# Patient Record
Sex: Female | Born: 1970 | Race: White | Hispanic: No
Health system: Midwestern US, Community
[De-identification: ages and names within clinical notes are randomized; demographics above are authoritative.]

## PROBLEM LIST (undated history)

## (undated) DIAGNOSIS — M7072 Other bursitis of hip, left hip: Secondary | ICD-10-CM

## (undated) DIAGNOSIS — Z1231 Encounter for screening mammogram for malignant neoplasm of breast: Secondary | ICD-10-CM

## (undated) DIAGNOSIS — R1310 Dysphagia, unspecified: Secondary | ICD-10-CM

## (undated) DIAGNOSIS — R109 Unspecified abdominal pain: Principal | ICD-10-CM

## (undated) DIAGNOSIS — I10 Essential (primary) hypertension: Secondary | ICD-10-CM

## (undated) DIAGNOSIS — E785 Hyperlipidemia, unspecified: Secondary | ICD-10-CM

## (undated) DIAGNOSIS — J302 Other seasonal allergic rhinitis: Secondary | ICD-10-CM

## (undated) DIAGNOSIS — E119 Type 2 diabetes mellitus without complications: Secondary | ICD-10-CM

## (undated) HISTORY — DX: Other seasonal allergic rhinitis: J30.2

## (undated) HISTORY — PX: KIDNEY STONE SURGERY: SHX686

## (undated) HISTORY — DX: Hyperlipidemia, unspecified: E78.5

## (undated) HISTORY — PX: TUBAL LIGATION: SHX77

---

## 2015-04-24 ENCOUNTER — Emergency Department
Admit: 2015-04-24 | Disposition: A | Discharge status: Home / Self Care | Payer: Self-pay | Admitting: Emergency Medicine

## 2015-04-24 LAB — CBC WITH DIFFERENTIAL/PLATELET
BASOS ABS: 0.1 10*3/uL (ref 0.0–0.1)
BASOS PCT: 0.6 %
EOS ABS: 0.2 10*3/uL (ref 0.0–0.7)
Eosinophil %: 2 %
HCT: 26.5 % — ABNORMAL LOW (ref 35.0–47.0)
HGB: 8.3 g/dL — AB (ref 12.0–16.0)
LYMPHS PCT: 21.6 %
Lymphocyte #: 2 10*3/uL (ref 1.0–3.6)
MCH: 18.9 pg — AB (ref 26.0–34.0)
MCHC: 31.4 g/dL — AB (ref 32.0–36.0)
MCV: 60 fL — ABNORMAL LOW (ref 80–100)
MONO ABS: 0.4 x10 3/mm (ref 0.2–0.9)
Monocyte %: 4.3 %
Neutrophil #: 6.6 10*3/uL — ABNORMAL HIGH (ref 1.4–6.5)
Neutrophil %: 71.5 %
Platelet: 296 10*3/uL (ref 150–440)
RBC: 4.41 10*6/uL (ref 3.80–5.20)
RDW: 18.9 % — AB (ref 11.5–14.5)
WBC: 9.2 10*3/uL (ref 3.6–11.0)

## 2015-04-24 LAB — BASIC METABOLIC PANEL
Anion Gap: 7 (ref 7–16)
BUN: 11 mg/dL
CO2: 24 mmol/L
Calcium, Total: 8.9 mg/dL
Chloride: 106 mmol/L
Creatinine: 0.74 mg/dL
GLUCOSE: 153 mg/dL — AB
Potassium: 3.4 mmol/L — ABNORMAL LOW
Sodium: 137 mmol/L

## 2015-04-24 LAB — URINALYSIS, COMPLETE
BACTERIA: NONE SEEN
Bilirubin,UR: NEGATIVE
Blood: NEGATIVE
GLUCOSE, UR: NEGATIVE mg/dL (ref 0–75)
KETONE: NEGATIVE
NITRITE: NEGATIVE
Ph: 5 (ref 4.5–8.0)
Protein: 30
SPECIFIC GRAVITY: 1.016 (ref 1.003–1.030)

## 2015-04-24 LAB — TROPONIN I: Troponin-I: <0.03 ng/mL

## 2015-04-25 LAB — TROPONIN I: Troponin-I: <0.03 ng/mL

## 2015-04-25 LAB — PRO B NATRIURETIC PEPTIDE: B-Type Natriuretic Peptide: 95 pg/mL

## 2015-07-22 ENCOUNTER — Encounter: Payer: Self-pay | Admitting: *Deleted

## 2015-07-22 ENCOUNTER — Emergency Department
Admission: EM | Admit: 2015-07-22 | Discharge: 2015-07-22 | Disposition: A | Discharge status: Home / Self Care | Payer: Worker's Compensation | Attending: Emergency Medicine | Admitting: Emergency Medicine

## 2015-07-22 ENCOUNTER — Emergency Department: Payer: Worker's Compensation

## 2015-07-22 DIAGNOSIS — S3992XA Unspecified injury of lower back, initial encounter: Secondary | ICD-10-CM | POA: Insufficient documentation

## 2015-07-22 DIAGNOSIS — Y9389 Activity, other specified: Secondary | ICD-10-CM | POA: Diagnosis not present

## 2015-07-22 DIAGNOSIS — Z720 Tobacco use: Secondary | ICD-10-CM | POA: Diagnosis not present

## 2015-07-22 DIAGNOSIS — Y9289 Other specified places as the place of occurrence of the external cause: Secondary | ICD-10-CM | POA: Diagnosis not present

## 2015-07-22 DIAGNOSIS — M5136 Other intervertebral disc degeneration, lumbar region: Secondary | ICD-10-CM

## 2015-07-22 DIAGNOSIS — S5001XA Contusion of right elbow, initial encounter: Secondary | ICD-10-CM | POA: Diagnosis not present

## 2015-07-22 DIAGNOSIS — Y990 Civilian activity done for income or pay: Secondary | ICD-10-CM | POA: Insufficient documentation

## 2015-07-22 DIAGNOSIS — W010XXA Fall on same level from slipping, tripping and stumbling without subsequent striking against object, initial encounter: Secondary | ICD-10-CM | POA: Insufficient documentation

## 2015-07-22 MED ORDER — CYCLOBENZAPRINE HCL 10 MG PO TABS
10.0000 mg | ORAL_TABLET | Freq: Once | ORAL | Status: AC
  Administered 2015-07-22: 10 mg via ORAL
  Filled 2015-07-22: qty 1

## 2015-07-22 MED ORDER — CYCLOBENZAPRINE HCL 10 MG PO TABS: 10.0000 mg | ORAL_TABLET | Freq: Three times a day (TID) | ORAL | Status: DC | PRN

## 2015-07-22 NOTE — ED Provider Notes (Signed)
[facility] Emergency Department Provider Note  ____________________________________________  Time seen: 3:30 AM  I have reviewed the triage vital signs and the nursing notes.   HISTORY  Chief Complaint Back Pain     HPI Ashley Snow is a 44 y.o. female resents with low back pain status post slip and fall at work on a wet floor. Patient admits to nonradiating low back pain. Current pain score 8 out of 10. Pain does not radiate to the patient's legs     Past medical history none  There are no active problems to display for this patient.   Past surgical history None  Current Outpatient Rx  Name  Route  Sig  Dispense  Refill  . ibuprofen (ADVIL,MOTRIN) 200 MG tablet   Oral   Take 600 mg by mouth every 6 (six) hours as needed.         . IRON, FERROUS GLUCONATE, PO   Oral   Take 1 tablet by mouth daily.         . cyclobenzaprine (FLEXERIL) 10 MG tablet   Oral   Take 1 tablet (10 mg total) by mouth 3 (three) times daily as needed for muscle spasms.   30 tablet   0     Allergies Iodine   Social History History  Substance Use Topics  . Smoking status: Current Every Day Smoker  . Smokeless tobacco: Not on file  . Alcohol Use: Yes    Review of Systems  Constitutional: Negative for fever. Eyes: Negative for visual changes. ENT: Negative for sore throat. Cardiovascular: Negative for chest pain. Respiratory: Negative for shortness of breath. Gastrointestinal: Negative for abdominal pain, vomiting and diarrhea. Genitourinary: Negative for dysuria. Musculoskeletal: Positive for back pain. Skin: Negative for rash. Neurological: Negative for headaches, focal weakness or numbness.   10-point ROS otherwise negative.  ____________________________________________   PHYSICAL EXAM:  VITAL SIGNS: ED Triage Vitals  Enc Vitals Group     BP 07/22/15 0212 184/94 mmHg     Pulse Rate 07/22/15 0212 87     Resp 07/22/15 0212 20   Temp 07/22/15 0212 98.4 F (36.9 C)     Temp Source 07/22/15 0212 Oral     SpO2 07/22/15 0212 99 %     Weight 07/22/15 0212 240 lb (108.863 kg)     Height 07/22/15 0212 5\' 8"  (1.727 m)     Head Cir --      Peak Flow --      Pain Score 07/22/15 0214 7     Pain Loc --      Pain Edu? --      Excl. in GC? --      Constitutional: Alert and oriented. Well appearing and in no distress. Eyes: Conjunctivae are normal. PERRL. Normal extraocular movements. ENT   Head: Normocephalic and atraumatic.   Nose: No congestion/rhinnorhea.   Mouth/Throat: Mucous membranes are moist.   Neck: No stridor. Cardiovascular: Normal rate, regular rhythm. Normal and symmetric distal pulses are present in all extremities. No murmurs, rubs, or gallops. Respiratory: Normal respiratory effort without tachypnea nor retractions. Breath sounds are clear and equal bilaterally. No wheezes/rales/rhonchi. Gastrointestinal: Soft and nontender. No distention. There is no CVA tenderness. Genitourinary: deferred Musculoskeletal: Nontender with normal range of motion in all extremities. No joint effusions.  No lower extremity tenderness nor edema.tenderness to palpation right elbow and lumbar spine Neurologic:  Normal speech and language. No gross focal neurologic deficits are appreciated. Speech is normal.  Skin:  Skin  is warm, dry and intact. No rash noted. Psychiatric: Mood and affect are normal. Speech and behavior are normal. Patient exhibits appropriate insight and judgment.  ____________________________________________     RADIOLOGY IMPRESSION: 1. No fracture or dislocation. 2. Mild posterior soft tissue prominence. This can be seen in the setting of hematoma or edema.   Electronically Signed By: Rubye Oaks M.D. On: 07/22/2015 04:29   INITIAL IMPRESSION / ASSESSMENT AND PLAN / ED COURSE  Pertinent labs & imaging results that were available during my care of the patient were reviewed  by me and considered in my medical decision making (see chart for details).    ____________________________________________   FINAL CLINICAL IMPRESSION(S) / ED DIAGNOSES  Final diagnoses:  Degenerative disc disease, lumbar  Elbow contusion, right, initial encounter      Darci Current, MD 07/24/15 [phone]

## 2015-07-22 NOTE — Discharge Instructions (Signed)
Contusion A contusion is a deep bruise. Contusions are the result of an injury that caused bleeding under the skin. The contusion may turn blue, purple, or yellow. Minor injuries will give you a painless contusion, but more severe contusions may stay painful and swollen for a few weeks.  CAUSES  A contusion is usually caused by a blow, trauma, or direct force to an area of the body. SYMPTOMS   Swelling and redness of the injured area.  Bruising of the injured area.  Tenderness and soreness of the injured area.  Pain. DIAGNOSIS  The diagnosis can be made by taking a history and physical exam. An X-ray, CT scan, or MRI may be needed to determine if there were any associated injuries, such as fractures. TREATMENT  Specific treatment will depend on what area of the body was injured. In general, the best treatment for a contusion is resting, icing, elevating, and applying cold compresses to the injured area. Over-the-counter medicines may also be recommended for pain control. Ask your caregiver what the best treatment is for your contusion. HOME CARE INSTRUCTIONS   Put ice on the injured area.  Put ice in a plastic bag.  Place a towel between your skin and the bag.  Leave the ice on for 15-20 minutes, 3-4 times a day, or as directed by your health care provider.  Only take over-the-counter or prescription medicines for pain, discomfort, or fever as directed by your caregiver. Your caregiver may recommend avoiding anti-inflammatory medicines (aspirin, ibuprofen, and naproxen) for 48 hours because these medicines may increase bruising.  Rest the injured area.  If possible, elevate the injured area to reduce swelling. SEEK IMMEDIATE MEDICAL CARE IF:   You have increased bruising or swelling.  You have pain that is getting worse.  Your swelling or pain is not relieved with medicines. MAKE SURE YOU:   Understand these instructions.  Will watch your condition.  Will get help right  away if you are not doing well or get worse. Document Released: 09/21/2005 Document Revised: 12/17/2013 Document Reviewed: 10/17/2011 [facility] Patient Information 2015 [place], [place]. This information is not intended to replace advice given to you by your health care provider. Make sure you discuss any questions you have with your health care provider.  Degenerative Disk Disease Degenerative disk disease is a condition caused by the changes that occur in the cushions of the backbone (spinal disks) as you grow older. Spinal disks are soft and compressible disks located between the bones of the spine (vertebrae). They act like shock absorbers. Degenerative disk disease can affect the whole spine. However, the neck and lower back are most commonly affected. Many changes can occur in the spinal disks with aging, such as:  The spinal disks may dry and shrink.  Small tears may occur in the tough, outer covering of the disk (annulus).  The disk space may become smaller due to loss of water.  Abnormal growths in the bone (spurs) may occur. This can put pressure on the nerve roots exiting the spinal canal, causing pain.  The spinal canal may become narrowed. CAUSES  Degenerative disk disease is a condition caused by the changes that occur in the spinal disks with aging. The exact cause is not known, but there is a genetic basis for many patients. Degenerative changes can occur due to loss of fluid in the disk. This makes the disk thinner and reduces the space between the backbones. Small cracks can develop in the outer layer of the disk.  This can lead to the breakdown of the disk. You are more likely to get degenerative disk disease if you are overweight. Smoking cigarettes and doing heavy work such as weightlifting can also increase your risk of this condition. Degenerative changes can start after a sudden injury. Growth of bone spurs can compress the nerve roots and cause pain.  SYMPTOMS  The symptoms  vary from person to person. Some people may have no pain, while others have severe pain. The pain may be so severe that it can limit your activities. The location of the pain depends on the part of your backbone that is affected. You will have neck or arm pain if a disk in the neck area is affected. You will have pain in your back, buttocks, or legs if a disk in the lower back is affected. The pain becomes worse while bending, reaching up, or with twisting movements. The pain may start gradually and then get worse as time passes. It may also start after a [place] or minor injury. You may feel numbness or tingling in the arms or legs.  DIAGNOSIS  Your caregiver will ask you about your symptoms and about activities or habits that may cause the pain. He or she may also ask about any injuries, diseases, or treatments you have had earlier. Your caregiver will examine you to check for the range of movement that is possible in the affected area, to check for strength in your extremities, and to check for sensation in the areas of the arms and legs supplied by different nerve roots. An X-ray of the spine may be taken. Your caregiver may suggest other imaging tests, such as magnetic resonance imaging (MRI), if needed.  TREATMENT  Treatment includes rest, modifying your activities, and applying ice and heat. Your caregiver may prescribe medicines to reduce your pain and may ask you to do some exercises to strengthen your back. In some cases, you may need surgery. You and your caregiver will decide on the treatment that is best for you. HOME CARE INSTRUCTIONS   Follow proper lifting and walking techniques as advised by your caregiver.  Maintain good posture.  Exercise regularly as advised.  Perform relaxation exercises.  Change your sitting, standing, and sleeping habits as advised. Change positions frequently.  Lose weight as advised.  Stop smoking if you smoke.  Wear supportive footwear. SEEK MEDICAL CARE  IF:  Your pain does not go away within 1 to 4 weeks. SEEK IMMEDIATE MEDICAL CARE IF:   Your pain is severe.  You notice weakness in your arms, hands, or legs.  You begin to lose control of your bladder or bowel movements. MAKE SURE YOU:   Understand these instructions.  Will watch your condition.  Will get help right away if you are not doing well or get worse. Document Released: 10/09/2007 Document Revised: 03/05/2012 Document Reviewed: 04/15/2014 [facility] Patient Information 2015 [place], [place]. This information is not intended to replace advice given to you by your health care provider. Make sure you discuss any questions you have with your health care provider.

## 2015-07-22 NOTE — ED Notes (Signed)
Pt to triage via wheelchair. Pt fell tonight at work on a wet floor.  Pt has lower back pain. States WC

## 2015-07-22 NOTE — ED Notes (Signed)
Patient with no complaints at this time. Respirations even and unlabored. Skin warm/dry. Discharge instructions reviewed with patient at this time. Patient given opportunity to voice concerns/ask questions. Patient discharged at this time and left Emergency Department with steady gait.   

## 2015-10-01 ENCOUNTER — Observation Stay
Admission: EM | Admit: 2015-10-01 | Discharge: 2015-10-03 | Disposition: A | Discharge status: Home / Self Care | Payer: Self-pay | Attending: Internal Medicine | Admitting: Internal Medicine

## 2015-10-01 ENCOUNTER — Emergency Department: Payer: Self-pay

## 2015-10-01 ENCOUNTER — Encounter: Payer: Self-pay | Admitting: *Deleted

## 2015-10-01 DIAGNOSIS — R61 Generalized hyperhidrosis: Secondary | ICD-10-CM | POA: Insufficient documentation

## 2015-10-01 DIAGNOSIS — R51 Headache: Secondary | ICD-10-CM | POA: Insufficient documentation

## 2015-10-01 DIAGNOSIS — I160 Hypertensive urgency: Secondary | ICD-10-CM | POA: Insufficient documentation

## 2015-10-01 DIAGNOSIS — E119 Type 2 diabetes mellitus without complications: Secondary | ICD-10-CM | POA: Insufficient documentation

## 2015-10-01 DIAGNOSIS — Z8489 Family history of other specified conditions: Secondary | ICD-10-CM | POA: Insufficient documentation

## 2015-10-01 DIAGNOSIS — E876 Hypokalemia: Secondary | ICD-10-CM | POA: Insufficient documentation

## 2015-10-01 DIAGNOSIS — G92 Toxic encephalopathy: Principal | ICD-10-CM | POA: Insufficient documentation

## 2015-10-01 DIAGNOSIS — F17200 Nicotine dependence, unspecified, uncomplicated: Secondary | ICD-10-CM | POA: Insufficient documentation

## 2015-10-01 DIAGNOSIS — G459 Transient cerebral ischemic attack, unspecified: Secondary | ICD-10-CM

## 2015-10-01 DIAGNOSIS — R519 Headache, unspecified: Secondary | ICD-10-CM | POA: Diagnosis present

## 2015-10-01 DIAGNOSIS — Z91041 Radiographic dye allergy status: Secondary | ICD-10-CM | POA: Insufficient documentation

## 2015-10-01 DIAGNOSIS — Z79899 Other long term (current) drug therapy: Secondary | ICD-10-CM | POA: Insufficient documentation

## 2015-10-01 DIAGNOSIS — R7301 Impaired fasting glucose: Secondary | ICD-10-CM | POA: Insufficient documentation

## 2015-10-01 HISTORY — DX: Essential (primary) hypertension: I10

## 2015-10-01 LAB — CBC
HEMATOCRIT: 37.9 % (ref 35.0–47.0)
HEMOGLOBIN: 12.6 g/dL (ref 12.0–16.0)
MCH: 26.8 pg (ref 26.0–34.0)
MCHC: 33.2 g/dL (ref 32.0–36.0)
MCV: 80.8 fL (ref 80.0–100.0)
Platelets: 250 10*3/uL (ref 150–440)
RBC: 4.68 MIL/uL (ref 3.80–5.20)
RDW: 14.9 % — ABNORMAL HIGH (ref 11.5–14.5)
WBC: 16.6 10*3/uL — AB (ref 3.6–11.0)

## 2015-10-01 LAB — DIFFERENTIAL
BASOS ABS: 0.1 10*3/uL (ref 0–0.1)
Basophils Relative: 1 %
Eosinophils Absolute: 0 10*3/uL (ref 0–0.7)
Eosinophils Relative: 0 %
LYMPHS PCT: 12 %
Lymphs Abs: 2.1 10*3/uL (ref 1.0–3.6)
MONO ABS: 0.6 10*3/uL (ref 0.2–0.9)
MONOS PCT: 4 %
NEUTROS ABS: 13.8 10*3/uL — AB (ref 1.4–6.5)
Neutrophils Relative %: 83 %

## 2015-10-01 LAB — COMPREHENSIVE METABOLIC PANEL
ALK PHOS: 72 U/L (ref 38–126)
ALT: 52 U/L (ref 14–54)
AST: 54 U/L — AB (ref 15–41)
Albumin: 4.3 g/dL (ref 3.5–5.0)
Anion gap: 9 (ref 5–15)
BUN: 10 mg/dL (ref 6–20)
CALCIUM: 9.2 mg/dL (ref 8.9–10.3)
CHLORIDE: 106 mmol/L (ref 101–111)
CO2: 22 mmol/L (ref 22–32)
CREATININE: 0.82 mg/dL (ref 0.44–1.00)
Glucose, Bld: 177 mg/dL — ABNORMAL HIGH (ref 65–99)
Potassium: 3.4 mmol/L — ABNORMAL LOW (ref 3.5–5.1)
Sodium: 137 mmol/L (ref 135–145)
Total Bilirubin: 0.7 mg/dL (ref 0.3–1.2)
Total Protein: 7.6 g/dL (ref 6.5–8.1)

## 2015-10-01 LAB — GLUCOSE, CAPILLARY: Glucose-Capillary: 179 mg/dL — ABNORMAL HIGH (ref 65–99)

## 2015-10-01 LAB — APTT: APTT: 25 s (ref 24–36)

## 2015-10-01 LAB — PROTIME-INR
INR: 0.99
Prothrombin Time: 13.3 seconds (ref 11.4–15.0)

## 2015-10-01 MED ORDER — LORAZEPAM 2 MG/ML IJ SOLN
1.0000 mg | Freq: Once | INTRAMUSCULAR | Status: AC
  Administered 2015-10-01: 1 mg via INTRAVENOUS
  Filled 2015-10-01: qty 1

## 2015-10-01 MED ORDER — ENALAPRILAT 1.25 MG/ML IV SOLN
0.6250 mg | Freq: Once | INTRAVENOUS | Status: AC
  Administered 2015-10-01: 0.625 mg via INTRAVENOUS
  Filled 2015-10-01: qty 2

## 2015-10-01 MED ORDER — MORPHINE SULFATE (PF) 4 MG/ML IV SOLN
4.0000 mg | Freq: Once | INTRAVENOUS | Status: AC
  Administered 2015-10-01: 4 mg via INTRAVENOUS
  Filled 2015-10-01: qty 1

## 2015-10-01 MED ORDER — ONDANSETRON HCL 4 MG/2ML IJ SOLN
4.0000 mg | Freq: Once | INTRAMUSCULAR | Status: AC
  Administered 2015-10-01: 4 mg via INTRAVENOUS
  Filled 2015-10-01: qty 2

## 2015-10-01 NOTE — ED Provider Notes (Signed)
[facility] Emergency Department Provider Note  ____________________________________________  Time seen: Approximately 10:41 PM  I have reviewed the triage vital signs and the nursing notes.   HISTORY  Chief Complaint Headache    HPI Ashley Snow is a 44 y.o. female patient reports she used some cocaine a small amount this afternoon at about 2 or 2:30 and she took a nap. When she woke up from the nap she wanted to call her fianc but could not figure out how to use the cell phone that she's had for quite some time and notes that he is very easily. After some time she was able to figure and had used a cell phone and her confusion decreased. At that time her she began noticing a headache which gradually got worse and worse use a severe headache at the present time. Blood pressure was 199 systolic when I saw her. At that time she was not confused and everything was working normally but she had a severe headache. The headache was just severe it was not throbbing is not localized. I gave the patient 1 mg of Ativan and 4 morphine IV along with 4 of Zofran. Patient reports the headache markedly improved however her blood pressures got up to 206 systolic now I'm going to give her enalapril 0.625 IV in an effort to bring the blood pressure down I'm not been beta blockers in case there is any residual cocaine effect.  Past Medical History  Diagnosis Date  . Hypertension     There are no active problems to display for this patient.   Past Surgical History  Procedure Laterality Date  . Cesarean section      Current Outpatient Rx  Name  Route  Sig  Dispense  Refill  . acetaminophen (TYLENOL) 500 MG tablet   Oral   Take 500 mg by mouth every 6 (six) hours as needed.         . cyclobenzaprine (FLEXERIL) 10 MG tablet   Oral   Take 1 tablet (10 mg total) by mouth 3 (three) times daily as needed for muscle spasms. Patient not taking: Reported on 10/01/2015   30  tablet   0     Allergies Iodine  No family history on file.  Social History Social History  Substance Use Topics  . Smoking status: Current Every Day Smoker  . Smokeless tobacco: None  . Alcohol Use: Yes    Review of Systems Constitutional: No fever/chills Eyes: Patient reports a couple months of blurry vision and she reports she has to hold things at arm's length to be over read themat. Cardiovascular: Denies chest pain. Respiratory: Denies shortness of breath. Gastrointestinal: No abdominal pain.  No nausea, no vomiting.  No diarrhea.  No constipation. Genitourinary: Negative for dysuria. Musculoskeletal: Negative for back pain. Skin: Negative for rash. Neurological: Negative for headaches, focal weakness or numbness. GYN. Patient reports that in the last several months. Her periods become very irregular.  10-point ROS otherwise negative.  ____________________________________________   PHYSICAL EXAM:  VITAL SIGNS: ED Triage Vitals  Enc Vitals Group     BP 10/01/15 2119 199/129 mmHg     Pulse Rate 10/01/15 2119 108     Resp 10/01/15 2158 19     Temp 10/01/15 2119 98.8 F (37.1 C)     Temp Source 10/01/15 2119 Oral     SpO2 10/01/15 2119 98 %     Weight 10/01/15 2119 240 lb (108.863 kg)     Height  10/01/15 2119  (1.727 m)     Head Cir --      Peak Flow --      Pain Score 10/01/15 2129 7     Pain Loc --      Pain Edu? --      Excl. in GC? --    Constitutional: Alert and oriented. Well appearing andMoving uncomfortably on the bed with her headache.  Eyes: Conjunctivae are normal. PERRL. EOMI. Head: Atraumatic. Nose: No congestion/rhinnorhea. Mouth/Throat: Mucous membranes are moist.  Oropharynx non-erythematous. Neck: No stridor.  Neck is supple  Cardiovascular: Normal rate, regular rhythm. Grossly normal heart sounds.  Good peripheral circulation. Respiratory: Normal respiratory effort.  No retractions. Lungs CTAB. Gastrointestinal: Soft and  nontender. No distention. No abdominal bruits. No CVA tenderness. Musculoskeletal: No lower extremity tenderness nor edema.  No joint effusions. Neurologic:  Normal speech and language. No gross focal neurologic deficits are appreciated. No gait instability. Skin:  Skin is warm, dry and intact. No rash noted. Psychiatric: Mood and affect are normal. Speech and behavior are normal.  ____________________________________________   LABS (all labs ordered are listed, but only abnormal results are displayed)  Labs Reviewed  CBC - Abnormal; Notable for the following:    WBC 16.6 (*)    RDW 14.9 (*)    All other components within normal limits  DIFFERENTIAL - Abnormal; Notable for the following:    Neutro Abs 13.8 (*)    All other components within normal limits  COMPREHENSIVE METABOLIC PANEL - Abnormal; Notable for the following:    Potassium 3.4 (*)    Glucose, Bld 177 (*)    AST 54 (*)    All other components within normal limits  GLUCOSE, CAPILLARY - Abnormal; Notable for the following:    Glucose-Capillary 179 (*)    All other components within normal limits  PROTIME-INR  APTT  CBG MONITORING, ED   ____________________________________________  EKG  EKG read and interpreted by me shows sinus tachycardia at a rate of 103 normal axis nonspecific ST-T wave changes  __________________CT shows no acute changes___________________________________________   PROCEDURES    ____________________________________________   INITIAL IMPRESSION / ASSESSMENT AND PLAN / ED COURSE  Pertinent labs & imaging results that were available during my care of the patient were reviewed by me and considered in my medical decision making (see chart for details).   ____________________________________________   FINAL CLINICAL IMPRESSION(S) / ED DIAGNOSES  Final diagnoses:  Hypertensive urgency  Transient cerebral ischemia, unspecified transient cerebral ischemia type      Arnaldo Natal,  MD 10/01/15 2320

## 2015-10-01 NOTE — ED Notes (Signed)
Patient transported to CT via wheelchair with RN

## 2015-10-01 NOTE — ED Notes (Signed)
MD at bedside. 

## 2015-10-01 NOTE — ED Notes (Signed)
PT states she woke up around 1630 today, feeling confused, dizzy and sweating. Pt says that she was not able to dial the phone to call her husband, because she could not remember how to work the phone. Confusion lasting about 15-20 minutes, the she states her "mind was clearer". After this she had a severe headache associated with nausea.

## 2015-10-01 NOTE — ED Notes (Signed)
While going to CT scan, pt admits that she has used crack cocaine today and does not want her significant other who is with her to know.

## 2015-10-02 DIAGNOSIS — R519 Headache, unspecified: Secondary | ICD-10-CM | POA: Diagnosis present

## 2015-10-02 DIAGNOSIS — R51 Headache: Secondary | ICD-10-CM

## 2015-10-02 LAB — TSH: TSH: 1.46 u[IU]/mL (ref 0.350–4.500)

## 2015-10-02 MED ORDER — OXYCODONE HCL 5 MG PO TABS
5.0000 mg | ORAL_TABLET | Freq: Four times a day (QID) | ORAL | Status: DC | PRN
  Administered 2015-10-02 – 2015-10-03 (×4): 5 mg via ORAL
  Filled 2015-10-02 (×4): qty 1

## 2015-10-02 MED ORDER — MAGNESIUM SULFATE 2 GM/50ML IV SOLN
2.0000 g | Freq: Once | INTRAVENOUS | Status: AC
  Administered 2015-10-02: 2 g via INTRAVENOUS
  Filled 2015-10-02: qty 50

## 2015-10-02 MED ORDER — HYDRALAZINE HCL 20 MG/ML IJ SOLN
10.0000 mg | INTRAMUSCULAR | Status: DC | PRN
  Administered 2015-10-02 (×2): 10 mg via INTRAVENOUS
  Filled 2015-10-02 (×2): qty 1

## 2015-10-02 MED ORDER — LISINOPRIL 20 MG PO TABS
20.0000 mg | ORAL_TABLET | Freq: Every day | ORAL | Status: DC
  Administered 2015-10-02 – 2015-10-03 (×2): 20 mg via ORAL
  Filled 2015-10-02 (×2): qty 1

## 2015-10-02 MED ORDER — ONDANSETRON HCL 4 MG PO TABS: 4.0000 mg | ORAL_TABLET | Freq: Four times a day (QID) | ORAL | Status: DC | PRN

## 2015-10-02 MED ORDER — LORAZEPAM 2 MG/ML IJ SOLN
0.5000 mg | Freq: Once | INTRAMUSCULAR | Status: AC
  Administered 2015-10-02: 0.5 mg via INTRAVENOUS
  Filled 2015-10-02: qty 1

## 2015-10-02 MED ORDER — ONDANSETRON HCL 4 MG/2ML IJ SOLN: 4.0000 mg | Freq: Four times a day (QID) | INTRAMUSCULAR | Status: DC | PRN

## 2015-10-02 MED ORDER — AMLODIPINE BESYLATE 5 MG PO TABS
5.0000 mg | ORAL_TABLET | Freq: Every day | ORAL | Status: DC
  Administered 2015-10-02: 5 mg via ORAL
  Filled 2015-10-02: qty 1

## 2015-10-02 MED ORDER — POTASSIUM CHLORIDE IN NACL 20-0.9 MEQ/L-% IV SOLN
INTRAVENOUS | Status: DC
Start: 2015-10-02 — End: 2015-10-02
  Administered 2015-10-02: 04:00:00 via INTRAVENOUS
  Filled 2015-10-02 (×4): qty 1000

## 2015-10-02 MED ORDER — HEPARIN SODIUM (PORCINE) 5000 UNIT/ML IJ SOLN
5000.0000 [IU] | Freq: Three times a day (TID) | INTRAMUSCULAR | Status: DC
  Administered 2015-10-02 – 2015-10-03 (×3): 5000 [IU] via SUBCUTANEOUS
  Filled 2015-10-02 (×3): qty 1

## 2015-10-02 MED ORDER — METOCLOPRAMIDE HCL 5 MG/ML IJ SOLN: 5.0000 mg | Freq: Three times a day (TID) | INTRAMUSCULAR | Status: DC | PRN

## 2015-10-02 MED ORDER — ACETAMINOPHEN 650 MG RE SUPP: 650.0000 mg | Freq: Four times a day (QID) | RECTAL | Status: DC | PRN

## 2015-10-02 MED ORDER — MORPHINE SULFATE (PF) 2 MG/ML IV SOLN
2.0000 mg | Freq: Once | INTRAVENOUS | Status: AC
  Administered 2015-10-02: 2 mg via INTRAVENOUS
  Filled 2015-10-02: qty 1

## 2015-10-02 MED ORDER — SODIUM CHLORIDE 0.9 % IJ SOLN
3.0000 mL | Freq: Two times a day (BID) | INTRAMUSCULAR | Status: DC
  Administered 2015-10-02 – 2015-10-03 (×4): 3 mL via INTRAVENOUS

## 2015-10-02 MED ORDER — HYDROCHLOROTHIAZIDE 25 MG PO TABS
25.0000 mg | ORAL_TABLET | Freq: Every day | ORAL | Status: DC
  Administered 2015-10-02 – 2015-10-03 (×2): 25 mg via ORAL
  Filled 2015-10-02 (×2): qty 1

## 2015-10-02 MED ORDER — MORPHINE SULFATE (PF) 2 MG/ML IV SOLN
1.0000 mg | INTRAVENOUS | Status: DC | PRN
  Administered 2015-10-02 (×2): 1 mg via INTRAVENOUS
  Filled 2015-10-02 (×2): qty 1

## 2015-10-02 MED ORDER — ACETAMINOPHEN 325 MG PO TABS
650.0000 mg | ORAL_TABLET | Freq: Four times a day (QID) | ORAL | Status: DC | PRN
  Administered 2015-10-02 – 2015-10-03 (×3): 650 mg via ORAL
  Filled 2015-10-02 (×3): qty 2

## 2015-10-02 NOTE — Progress Notes (Signed)
Patient ID: Ashley Snow, female   DOB: [birth year], 44 y.o.   MRN: [id number] [facility] PROGRESS NOTE  PCP: No PCP Per Patient  HPI/Subjective: Patient uses crack cocaine and then had trouble thinking and trying to make a phone call. She had a severe headache. In the ER blood pressure was very elevated and she was admitted. Her headache got better after he gave some IV magnesium  Objective: Filed Vitals:   10/02/15 1307  BP: 184/92  Pulse: 95  Temp: 98.7 F (37.1 C)  Resp: 20    Filed Weights   10/01/15 2119 10/02/15 0226  Weight: 108.863 kg (240 lb) 115.486 kg (254 lb 9.6 oz)    ROS: Review of Systems  Constitutional: Positive for malaise/fatigue. Negative for fever and chills.  Eyes: Negative for blurred vision.  Respiratory: Negative for cough and shortness of breath.   Cardiovascular: Negative for chest pain.  Gastrointestinal: Negative for nausea, vomiting, abdominal pain, diarrhea and constipation.  Genitourinary: Negative for dysuria.  Musculoskeletal: Negative for joint pain.  Neurological: Positive for headaches. Negative for dizziness.   Exam: Physical Exam  Constitutional: She is oriented to person, place, and time.  HENT:  Nose: No mucosal edema.  Mouth/Throat: No oropharyngeal exudate or posterior oropharyngeal edema.  Eyes: Conjunctivae, EOM and lids are normal. Pupils are equal, round, and reactive to light.  Neck: No JVD present. Carotid bruit is not present. No edema present. No thyroid mass and no thyromegaly present.  Cardiovascular: S1 normal and S2 normal.  Exam reveals no gallop.   No murmur heard. Pulses:      Dorsalis pedis pulses are 2+ on the right side, and 2+ on the left side.  Respiratory: No respiratory distress. She has no wheezes. She has no rhonchi. She has no rales.  GI: Soft. Bowel sounds are normal. There is no tenderness.  Musculoskeletal:       Right ankle: She exhibits swelling.       Left ankle: She exhibits  swelling.  Lymphadenopathy:    She has no cervical adenopathy.  Neurological: She is alert and oriented to person, place, and time. No cranial nerve deficit.  Power 5 out of 5 upper and lower extremities  Skin: Skin is warm. No rash noted. Nails show no clubbing.  Psychiatric: She has a normal mood and affect.    Data Reviewed: Basic Metabolic Panel:  Recent Labs Lab 10/01/15 2158  NA 137  K 3.4*  CL 106  CO2 22  GLUCOSE 177*  BUN 10  CREATININE 0.82  CALCIUM 9.2   Liver Function Tests:  Recent Labs Lab 10/01/15 2158  AST 54*  ALT 52  ALKPHOS 72  BILITOT 0.7  PROT 7.6  ALBUMIN 4.3   CBC:  Recent Labs Lab 10/01/15 2158  WBC 16.6*  NEUTROABS 13.8*  HGB 12.6  HCT 37.9  MCV 80.8  PLT 250    CBG:  Recent Labs Lab 10/01/15 2230  GLUCAP 179*   Studies: Ct Head Wo Contrast  10/01/2015   CLINICAL DATA:  Confusion, diaphoresis, severe headache.  EXAM: CT HEAD WITHOUT CONTRAST  TECHNIQUE: Contiguous axial images were obtained from the base of the skull through the vertex without intravenous contrast.  COMPARISON:  04/24/2015  FINDINGS: There is no intracranial hemorrhage, mass or evidence of acute infarction. There is slight generalized atrophy with mild expansion of the CSF spaces around the frontal lobes. This is unchanged. No acute intracranial findings are evident. Gray matter and white matter are unremarkable,  with normal differentiation. No bone abnormalities evident. Visible paranasal sinuses are clear.  IMPRESSION: No acute intracranial findings.  Mild generalized atrophy.   Electronically Signed   By: Ellery Plunk M.D.   On: 10/01/2015 21:55    Scheduled Meds: . heparin  5,000 Units Subcutaneous 3 times per day  . hydrochlorothiazide  25 mg Oral Daily  . lisinopril  20 mg Oral Daily  . sodium chloride  3 mL Intravenous Q12H    Assessment/Plan:  1. Acute encephalopathy- resolved. Likely from drug use. 2. Accelerated hypertension- I ordered a  dose of Norvasc this morning. Likely the patient will not be able to afford this medication. I ordered hydrochlorothiazide. I will add lisinopril. Serial blood pressure readings. 3. Acute intractable headache unspecified- better after magnesium. Could be secondary to drug use or accelerated hypertension. Try to get better blood pressure control. DC IV morphine. 4. Impaired fasting glucose- check a hemoglobin A1c  Code Status:     Code Status Orders        Start     Ordered   10/02/15 0222  Full code   Continuous     10/02/15 0221     Disposition Plan: Potentially home tomorrow  Time spent: 35 minutes (patient seen earlier today)  Alford Highland  [facility]

## 2015-10-02 NOTE — H&P (Signed)
Ashley Snow is an 44 y.o. female.   Chief Complaint: Headache HPI: The patient presents emergency department complaining of headache that began approximately 6 hours prior to arrival. She admits that she smoked crack cocaine and then developed a global headache. She has not had any visual symptoms, motor weakness or sensory changes. She admits to being confused for approximately 20 minutes after the onset of her headache but she was able to drive this evening to pick up her boyfriend. However, once he was in the car with her he felt she may be too confused to drive which prompted him to bring her to the hospital. In the emergency department she was found to be hypertensive. Due to her ongoing headache and uncontrolled hypertension emergency department staff called for admission.  Past Medical History  Diagnosis Date  . Hypertension     Past Surgical History  Procedure Laterality Date  . Cesarean section      Family History  Problem Relation Age of Onset  . Dementia Mother    Social History:  reports that she has been smoking.  She does not have any smokeless tobacco history on file. She reports that she drinks alcohol. Her drug history is not on file.  Allergies:  Allergies  Allergen Reactions  . Iodine Rash    Medications Prior to Admission  Medication Sig Dispense Refill  . acetaminophen (TYLENOL) 500 MG tablet Take 500 mg by mouth every 6 (six) hours as needed.    . cyclobenzaprine (FLEXERIL) 10 MG tablet Take 1 tablet (10 mg total) by mouth 3 (three) times daily as needed for muscle spasms. (Patient not taking: Reported on 10/01/2015) 30 tablet 0    Results for orders placed or performed during the hospital encounter of 10/01/15 (from the past 48 hour(s))  Protime-INR     Status: None   Collection Time: 10/01/15  9:58 PM  Result Value Ref Range   Prothrombin Time 13.3 11.4 - 15.0 seconds   INR 0.99   APTT     Status: None   Collection Time: 10/01/15  9:58 PM  Result Value  Ref Range   aPTT 25 24 - 36 seconds  CBC     Status: Abnormal   Collection Time: 10/01/15  9:58 PM  Result Value Ref Range   WBC 16.6 (H) 3.6 - 11.0 K/uL   RBC 4.68 3.80 - 5.20 MIL/uL   Hemoglobin 12.6 12.0 - 16.0 g/dL   HCT 37.9 35.0 - 47.0 %   MCV 80.8 80.0 - 100.0 fL   MCH 26.8 26.0 - 34.0 pg   MCHC 33.2 32.0 - 36.0 g/dL   RDW 14.9 (H) 11.5 - 14.5 %   Platelets 250 150 - 440 K/uL  Differential     Status: Abnormal   Collection Time: 10/01/15  9:58 PM  Result Value Ref Range   Neutrophils Relative % 83 %   Neutro Abs 13.8 (H) 1.4 - 6.5 K/uL   Lymphocytes Relative 12 %   Lymphs Abs 2.1 1.0 - 3.6 K/uL   Monocytes Relative 4 %   Monocytes Absolute 0.6 0.2 - 0.9 K/uL   Eosinophils Relative 0 %   Eosinophils Absolute 0.0 0 - 0.7 K/uL   Basophils Relative 1 %   Basophils Absolute 0.1 0 - 0.1 K/uL  Comprehensive metabolic panel     Status: Abnormal   Collection Time: 10/01/15  9:58 PM  Result Value Ref Range   Sodium 137 135 - 145 mmol/L   Potassium 3.4 (  L) 3.5 - 5.1 mmol/L   Chloride 106 101 - 111 mmol/L   CO2 22 22 - 32 mmol/L   Glucose, Bld 177 (H) 65 - 99 mg/dL   BUN 10 6 - 20 mg/dL   Creatinine, Ser 0.82 0.44 - 1.00 mg/dL   Calcium 9.2 8.9 - 10.3 mg/dL   Total Protein 7.6 6.5 - 8.1 g/dL   Albumin 4.3 3.5 - 5.0 g/dL   AST 54 (H) 15 - 41 U/L   ALT 52 14 - 54 U/L   Alkaline Phosphatase 72 38 - 126 U/L   Total Bilirubin 0.7 0.3 - 1.2 mg/dL   GFR calc non Af Amer >60 >60 mL/min   GFR calc Af Amer >60 >60 mL/min    Comment: (NOTE) The eGFR has been calculated using the CKD EPI equation. This calculation has not been validated in all clinical situations. eGFR's persistently <60 mL/min signify possible Chronic Kidney Disease.    Anion gap 9 5 - 15  Glucose, capillary     Status: Abnormal   Collection Time: 10/01/15 10:30 PM  Result Value Ref Range   Glucose-Capillary 179 (H) 65 - 99 mg/dL   Ct Head Wo Contrast  10/01/2015   CLINICAL DATA:  Confusion, diaphoresis,  severe headache.  EXAM: CT HEAD WITHOUT CONTRAST  TECHNIQUE: Contiguous axial images were obtained from the base of the skull through the vertex without intravenous contrast.  COMPARISON:  04/24/2015  FINDINGS: There is no intracranial hemorrhage, mass or evidence of acute infarction. There is slight generalized atrophy with mild expansion of the CSF spaces around the frontal lobes. This is unchanged. No acute intracranial findings are evident. Gray matter and white matter are unremarkable, with normal differentiation. No bone abnormalities evident. Visible paranasal sinuses are clear.  IMPRESSION: No acute intracranial findings.  Mild generalized atrophy.   Electronically Signed   By: Andreas Newport M.D.   On: 10/01/2015 21:55    Review of Systems  Constitutional: Negative for fever and chills.  HENT: Negative for sore throat and tinnitus.   Eyes: Negative for blurred vision and redness.  Respiratory: Negative for cough and shortness of breath.   Cardiovascular: Negative for chest pain, palpitations, orthopnea and PND.  Gastrointestinal: Positive for nausea. Negative for vomiting, abdominal pain and diarrhea.  Genitourinary: Negative for dysuria, urgency and frequency.  Musculoskeletal: Negative for myalgias and joint pain.  Skin: Negative for rash.       No lesions  Neurological: Positive for headaches. Negative for speech change, focal weakness and weakness.  Endo/Heme/Allergies: Does not bruise/bleed easily.       No temperature intolerance  Psychiatric/Behavioral: Negative for depression and suicidal ideas.    Blood pressure 179/103, pulse 89, temperature 97.4 F (36.3 C), temperature source Oral, resp. rate 18, height _0  (1.676 m), weight 115.486 kg (254 lb 9.6 oz), last menstrual period 10/01/2015, SpO2 99 %. Physical Exam  Nursing note and vitals reviewed. Constitutional: She is oriented to person, place, and time. She appears well-developed and well-nourished.  HENT:  Head:  Normocephalic and atraumatic.  Eyes: Conjunctivae and EOM are normal. Pupils are equal, round, and reactive to light. No scleral icterus.  Neck: Normal range of motion. No JVD present. No tracheal deviation present. No thyromegaly present.  Cardiovascular: Normal rate, regular rhythm and normal heart sounds.  Exam reveals no gallop and no friction rub.   No murmur heard. Respiratory: Effort normal and breath sounds normal.  GI: Soft. Bowel sounds are normal. She exhibits no  distension. There is no tenderness.  Genitourinary:  Deferred  Musculoskeletal: Normal range of motion. She exhibits no edema.  Lymphadenopathy:    She has no cervical adenopathy.  Neurological: She is alert and oriented to person, place, and time. No cranial nerve deficit. She exhibits normal muscle tone.  Skin: Skin is warm and dry. No rash noted. No erythema.  Psychiatric: She has a normal mood and affect. Her behavior is normal. Judgment and thought content normal.     Assessment/Plan This is a 44 year old Caucasian female admitted for persistent headache. 1. Headache: Likely sequelae of uncontrolled hypertension. Does not meet cluster criteria or specifically migraine or tension-type headache. Goal is to manage blood pressure and hydrate. Reglan and morphine as needed. 2. Hypertension: The patient is received 1 dose of ACE inhibitor in the emergency department. 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 Will use Ativan as needed. 3. Hypokalemia: Replete potassium 4. DVT prophylaxis: Heparin 5. GI prophylaxis: None The patient is a full code. Time spent on admission was patient approximately 35 minutes  Harrie Foreman 10/02/2015, 3:11 AM

## 2015-10-02 NOTE — Plan of Care (Signed)
Problem: Discharge Progression Outcomes Goal: Discharge plan in place and appropriate Individualization:  Pt prefers to be called Ashley Snow who lives at home w/ her boyfriend and his mother. Both are currently out of work. Pt expresses stress over current circumstances  Hx HTN but is currently not taking any medication for it.  Moderate fall risk. +1 standby assist to bathroom. Pt understands how to use call system for assistance Goal: Other Discharge Outcomes/Goals Outcome: Progressing Plan of care progress to goals: 1. C/o headache relieved by PRN Morphine  2. Hemodynamically:             -High blood pressure, PRN Hydralazine given w/ noted relief              -IVF infusing as ordered              -Neuro Q2H 3. Heart healthy diet ordered, no appetite when she arrived to the floor 4. +1 standby assist to bathroom. Pt states she is currently on her menstrual period.

## 2015-10-02 NOTE — Plan of Care (Signed)
Problem: Discharge Progression Outcomes Goal: Activity appropriate for discharge plan Outcome: Progressing Pt is alert and oriented x 4, c/o headache improved with iv morphine, iv morphine d/c and patient start on oxycodone po. Pt is up in room independently, on room air, good appetite, bp remains elevated, pt on hydrochlorothiazide and lisinopril throughout shift, IV fluids d/c, IV magnesium given once. P is liekly to d/c on 10/8 to home. Uneventful shift.

## 2015-10-02 NOTE — Care Management (Signed)
Admitted to [facility] under observation with the diagnosis headache. Lives with finance Molly Maduro [place] [phone]).  Three kids ages 43,18, and 36. States she moved from [place]. To [place] about 4 years ago, moved to [place] about 3 years ago. Last primary care physician she seen was 4 years ago. No insurance. Suppose to start new job at [employer]. No hospitalizations other than child birth. Takes care of all basic activities of daily living herself, drives.  Last fall was in July. Good appetite. A resident of [place]. Applications for [facility] and Medication Management given to Ms. Amrhein. Will update Lelon Mast at [employer]. Gwenette Greet RN MSN Care Management [phone]

## 2015-10-02 NOTE — Clinical Social Work Note (Signed)
CSW consulted for assistance with medications. CSW notified RN CM and the RN CM will see patient for assistance with medications. Please reconsult CSW if necessary. York Spaniel MSW,LCSW [phone]

## 2015-10-02 NOTE — ED Notes (Signed)
Admitting MD at bedside.

## 2015-10-03 LAB — BASIC METABOLIC PANEL
Anion gap: 9 (ref 5–15)
BUN: 11 mg/dL (ref 6–20)
CHLORIDE: 101 mmol/L (ref 101–111)
CO2: 26 mmol/L (ref 22–32)
Calcium: 9.1 mg/dL (ref 8.9–10.3)
Creatinine, Ser: 0.7 mg/dL (ref 0.44–1.00)
GFR calc non Af Amer: >60 mL/min (ref >60)
Glucose, Bld: 179 mg/dL — ABNORMAL HIGH (ref 65–99)
POTASSIUM: 3.1 mmol/L — AB (ref 3.5–5.1)
SODIUM: 136 mmol/L (ref 135–145)

## 2015-10-03 LAB — MAGNESIUM: MAGNESIUM: 1.7 mg/dL (ref 1.7–2.4)

## 2015-10-03 LAB — HEMOGLOBIN A1C: HEMOGLOBIN A1C: 7.4 % — AB (ref 4.0–6.0)

## 2015-10-03 MED ORDER — LISINOPRIL 20 MG PO TABS: 20.0000 mg | ORAL_TABLET | Freq: Every day | ORAL | Status: DC

## 2015-10-03 MED ORDER — HYDROCHLOROTHIAZIDE 25 MG PO TABS: 25.0000 mg | ORAL_TABLET | Freq: Every day | ORAL | Status: DC

## 2015-10-03 MED ORDER — POTASSIUM CHLORIDE CRYS ER 20 MEQ PO TBCR
60.0000 meq | EXTENDED_RELEASE_TABLET | Freq: Once | ORAL | Status: AC
  Administered 2015-10-03: 08:00:00 60 meq via ORAL
  Filled 2015-10-03: qty 3

## 2015-10-03 NOTE — Progress Notes (Signed)
Up ad lib in room and tolerating well.Tylenol effective for reducing HA. Tolerating diet well. Fiance at bedside. Anticipating going home today.

## 2015-10-03 NOTE — Progress Notes (Signed)
MD order received in [facility] to discharge pt home today; verbally reviewed AVS with pt including medications/gave Rxs for Lisinopril and Hydrochlorothiazide to pt; pt verbalized understanding with no further questions voiced at this time; pt discharged via wheelchair by nursing to the visitor's entrance

## 2015-10-03 NOTE — Discharge Summary (Addendum)
[facility] [place] at [facility]   PATIENT NAME: Ashley Snow    MR#:  [id number]  DATE OF BIRTH:  10-23-1971  DATE OF ADMISSION:  10/01/2015 ADMITTING PHYSICIAN: Arnaldo Natal, MD  DATE OF DISCHARGE: 10/03/2015  9:50 AM  PRIMARY CARE PHYSICIAN: No PCP Per Patient    ADMISSION DIAGNOSIS:  Hypertensive urgency [I16.0] Transient cerebral ischemia, unspecified transient cerebral ischemia type [G45.9]   DISCHARGE DIAGNOSIS:  Acute intractable headache unspecified Accelerated hypertension DM SECONDARY DIAGNOSIS:   Past Medical History  Diagnosis Date  . Hypertension     HOSPITAL COURSE:   1. Acute encephalopathy- resolved. Likely from drug use. Head CT is negative. 2. Accelerated hypertension-she was treated with a dose of Norvasc but likely the patient will not be able to afford this medication. She has been treated with hydrochlorothiazide and lisinopril. Blood pressure is better controlled. 3. Acute intractable headache unspecified- better after magnesium. Could be secondary to drug use or accelerated hypertension. Follow-up PCP as outpatient. 4. DM2.  hemoglobin A1c 7.4. The patient needs tighter control and exercise, follow-up PCP. 5.  Hypokalemia. The patient was treated with a potassium supplement. DISCHARGE CONDITIONS:   Stable, discharged home today.  CONSULTS OBTAINED:  Treatment Team:  Shaune Pollack, MD  DRUG ALLERGIES:   Allergies  Allergen Reactions  . Iodine Rash    DISCHARGE MEDICATIONS:   Discharge Medication List as of 10/03/2015  9:42 AM    START taking these medications   Details  hydrochlorothiazide (HYDRODIURIL) 25 MG tablet Take 1 tablet (25 mg total) by mouth daily., Starting 10/03/2015, Until Discontinued, Print    lisinopril (PRINIVIL,ZESTRIL) 20 MG tablet Take 1 tablet (20 mg total) by mouth daily., Starting 10/03/2015, Until Discontinued, Print      CONTINUE these medications which have NOT CHANGED   Details   acetaminophen (TYLENOL) 500 MG tablet Take 500 mg by mouth every 6 (six) hours as needed., Until Discontinued, Historical Med      STOP taking these medications     cyclobenzaprine (FLEXERIL) 10 MG tablet          DISCHARGE INSTRUCTIONS:    If you experience worsening of your admission symptoms, develop shortness of breath, life threatening emergency, suicidal or homicidal thoughts you must seek medical attention immediately by calling 911 or calling your MD immediately  if symptoms less severe.  You Must read complete instructions/literature along with all the possible adverse reactions/side effects for all the Medicines you take and that have been prescribed to you. Take any new Medicines after you have completely understood and accept all the possible adverse reactions/side effects.   Please note  You were cared for by a hospitalist during your hospital stay. If you have any questions about your discharge medications or the care you received while you were in the hospital after you are discharged, you can call the unit and asked to speak with the hospitalist on call if the hospitalist that took care of you is not available. Once you are discharged, your primary care physician will handle any further medical issues. Please note that NO REFILLS for any discharge medications will be authorized once you are discharged, as it is imperative that you return to your primary care physician (or establish a relationship with a primary care physician if you do not have one) for your aftercare needs so that they can reassess your need for medications and monitor your lab values.    Today   SUBJECTIVE   Mild  Headache.   VITAL SIGNS:  Blood pressure 168/94, pulse 87, temperature 98.3 F (36.8 C), temperature source Oral, resp. rate 20, height  (1.676 m), weight 116.665 kg (257 lb 3.2 oz), last menstrual period 10/01/2015, SpO2 96 %.  I/O:   Intake/Output Summary (Last 24 hours) at  10/03/15 1442 Last data filed at 10/03/15 0936  Gross per 24 hour  Intake    720 ml  Output      0 ml  Net    720 ml    PHYSICAL EXAMINATION:  GENERAL:  44 y.o.-year-old patient lying in the bed with no acute distress. Obese. EYES: Pupils equal, round, reactive to light and accommodation. No scleral icterus. Extraocular muscles intact.  HEENT: Head atraumatic, normocephalic. Oropharynx and nasopharynx clear. Moist oral mucosa. NECK:  Supple, no jugular venous distention. No thyroid enlargement, no tenderness.  LUNGS: Normal breath sounds bilaterally, no wheezing, rales,rhonchi or crepitation. No use of accessory muscles of respiration.  CARDIOVASCULAR: S1, S2 normal. No murmurs, rubs, or gallops.  ABDOMEN: Soft, non-tender, non-distended. Bowel sounds present. No organomegaly or mass.  EXTREMITIES: No pedal edema, cyanosis, or clubbing.  NEUROLOGIC: Cranial nerves II through XII are intact. Muscle strength 5/5 in all extremities. Sensation intact. Gait not checked.  PSYCHIATRIC: The patient is alert and oriented x 3.  SKIN: No obvious rash, lesion, or ulcer.   DATA REVIEW:   CBC  Recent Labs Lab 10/01/15 2158  WBC 16.6*  HGB 12.6  HCT 37.9  PLT 250    Chemistries   Recent Labs Lab 10/01/15 2158 10/03/15 0440  NA 137 136  K 3.4* 3.1*  CL 106 101  CO2 22 26  GLUCOSE 177* 179*  BUN 10 11  CREATININE 0.82 0.70  CALCIUM 9.2 9.1  MG  --  1.7  AST 54*  --   ALT 52  --   ALKPHOS 72  --   BILITOT 0.7  --     Cardiac Enzymes No results for input(s): TROPONINI in the last 168 hours.  Microbiology Results  No results found for this or any previous visit.  RADIOLOGY:  Ct Head Wo Contrast  10/01/2015   CLINICAL DATA:  Confusion, diaphoresis, severe headache.  EXAM: CT HEAD WITHOUT CONTRAST  TECHNIQUE: Contiguous axial images were obtained from the base of the skull through the vertex without intravenous contrast.  COMPARISON:  04/24/2015  FINDINGS: There is no  intracranial hemorrhage, mass or evidence of acute infarction. There is slight generalized atrophy with mild expansion of the CSF spaces around the frontal lobes. This is unchanged. No acute intracranial findings are evident. Gray matter and white matter are unremarkable, with normal differentiation. No bone abnormalities evident. Visible paranasal sinuses are clear.  IMPRESSION: No acute intracranial findings.  Mild generalized atrophy.   Electronically Signed   By: Ellery Plunk M.D.   On: 10/01/2015 21:55        Management plans discussed with the patient, family and they are in agreement.  CODE STATUS:   TOTAL TIME TAKING CARE OF THIS PATIENT: 30 minutes.    Shaune Pollack M.D on 10/03/2015 at 2:42 PM  Between 7am to 6pm - Pager - [phone]  After 6pm go to [email or web address] - password [email or web address] [facility]  [facility]  Office  [phone]  CC: Primary care physician; No PCP Per Patient

## 2015-10-03 NOTE — Plan of Care (Signed)
Problem: Discharge Progression Outcomes Goal: Other Discharge Outcomes/Goals Outcome: Progressing Plan of care progress to goal for: 1. Pain-pt c/o pain in her head and back, prn meds given with improvement 2. Hemodynamically-             -VSS, pt remains afebrile 3. Complications-pt had elevated BP, prn meds given with improvement 4. Diet-pt tolerating diet this shift 5. Activity-pt up tot he BR unassisted

## 2015-10-03 NOTE — Discharge Instructions (Signed)
Low sodium, heart healthy and ADA diet. °Activity as tolerated. °

## 2015-10-14 IMAGING — CT CT HEAD W/O CM
3 of 4 series · 17 of 30 positions shown, 19 images · non-contrast
Comparison: 04/24/2015

CLINICAL DATA: Confusion, diaphoresis, severe headache.

EXAM:
CT HEAD WITHOUT CONTRAST
TECHNIQUE: Contiguous axial images were obtained from the base of the skull
through the vertex without intravenous contrast.

[Series 2: head wo · axial · 0.43mm/px · z∈[+655,+765]mm · 8 of 30 slices shown, 10 images]
[im 4/30  brain]
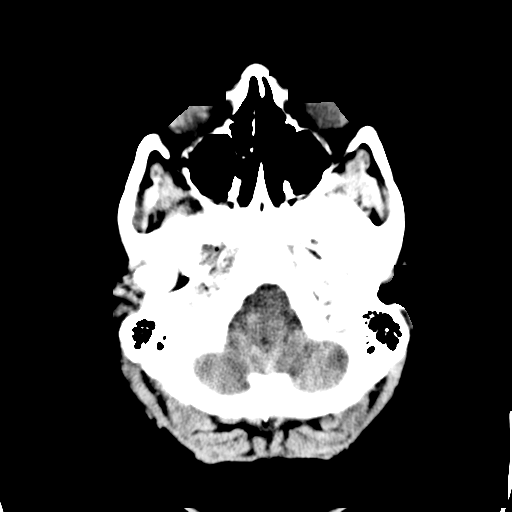
[im 4/30  bone]
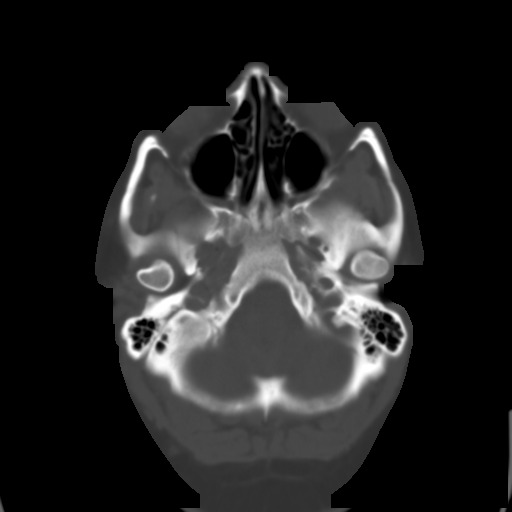
[im 7/30  brain]
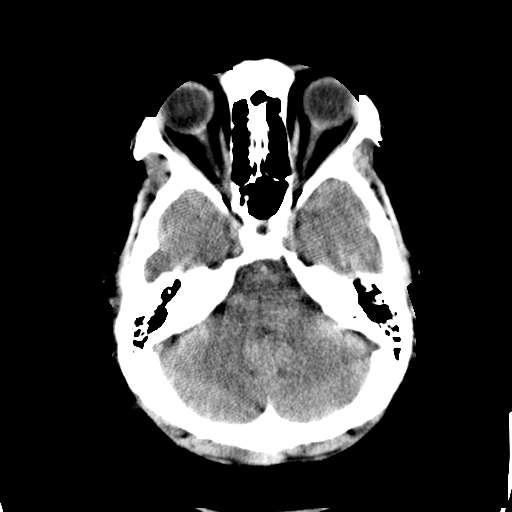
[im 10/30  brain]
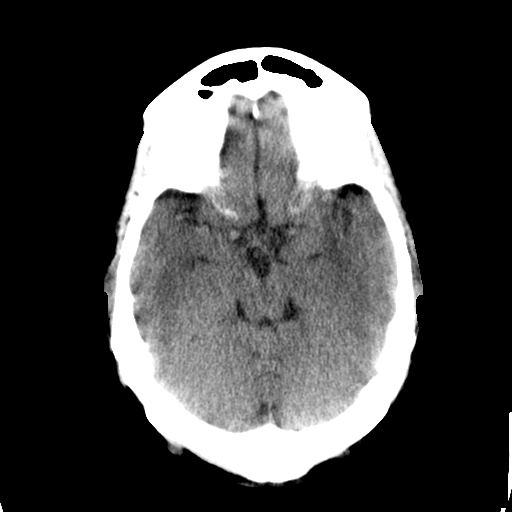
[im 13/30  brain]
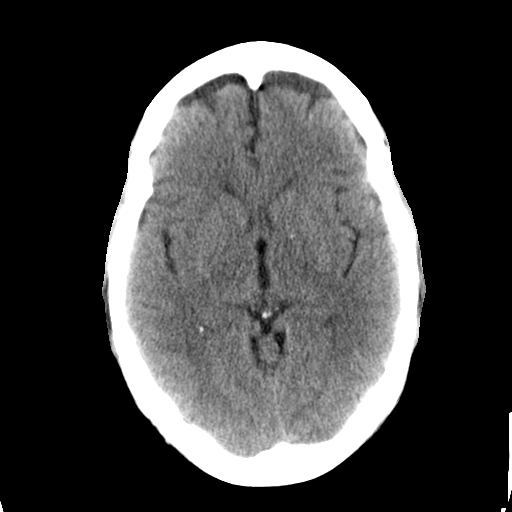
[im 17/30  brain]
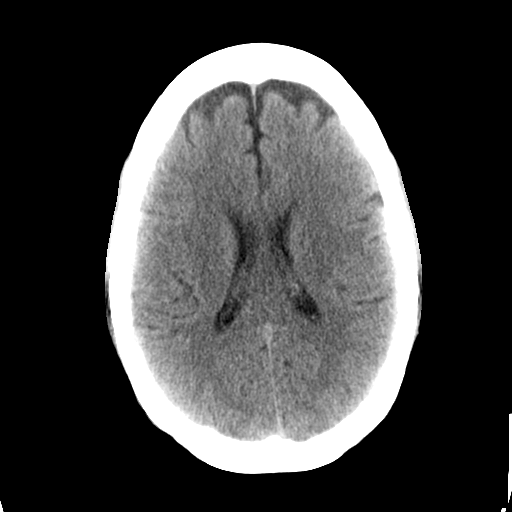
[im 17/30  bone]
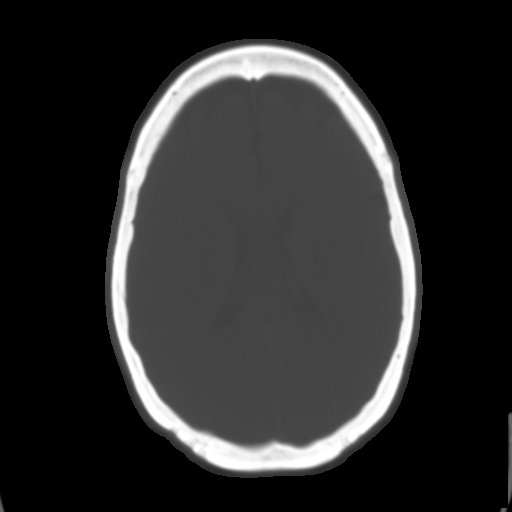
[im 20/30  brain]
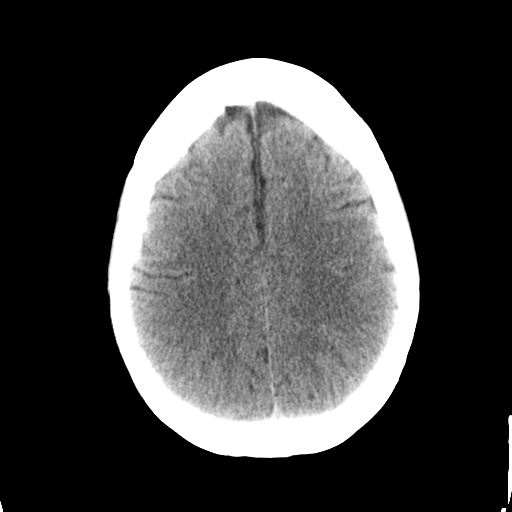
[im 23/30  brain]
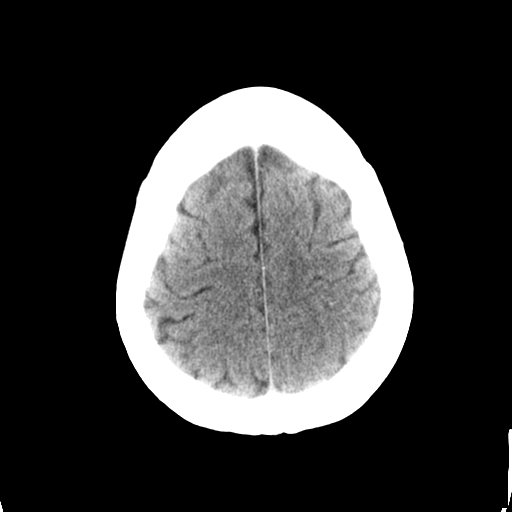
[im 26/30  brain]
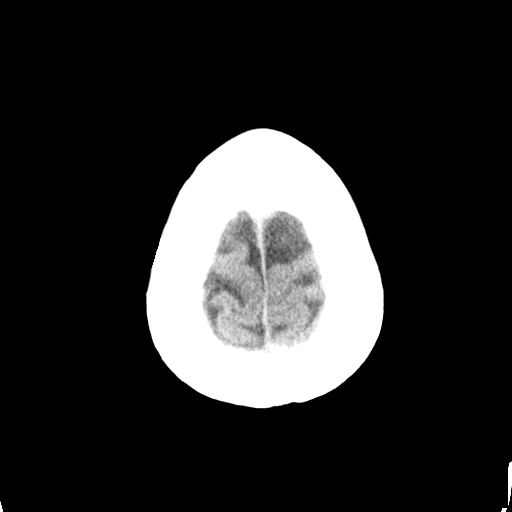

[Series 6: head wo recons · axial · 0.43mm/px · z∈[+705,+809]mm · 7 of 30 slices shown]
[im 4/30  brain]
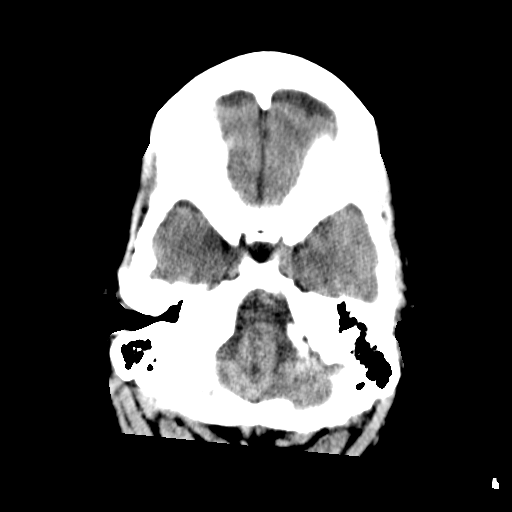
[im 8/30  brain]
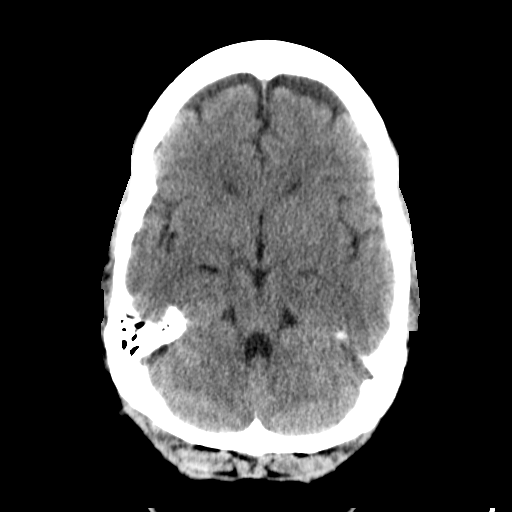
[im 11/30  brain]
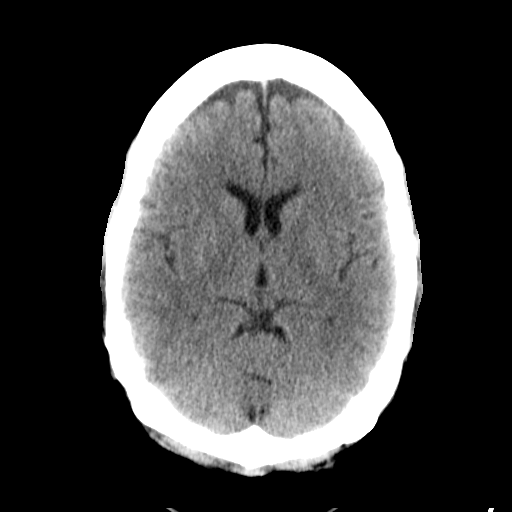
[im 15/30  brain]
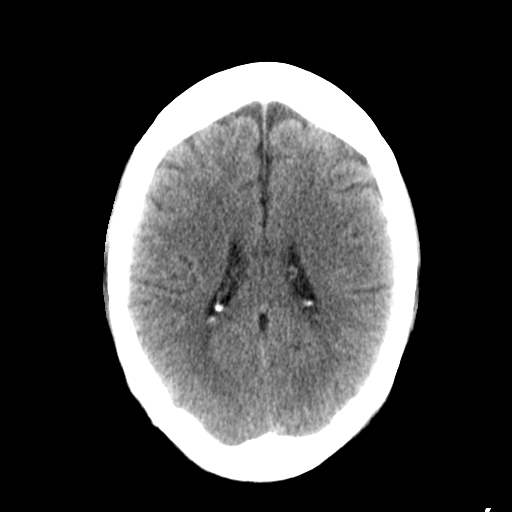
[im 19/30  brain]
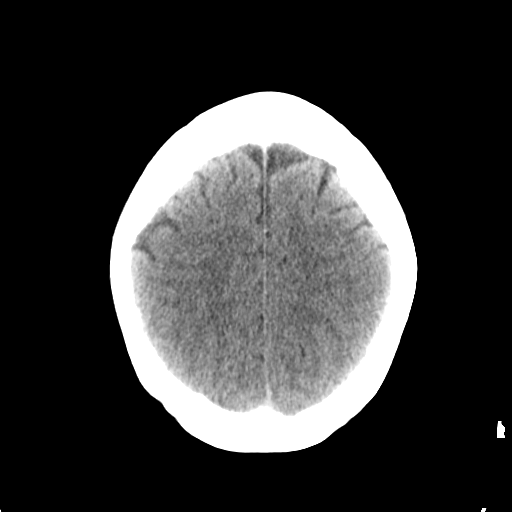
[im 22/30  brain]
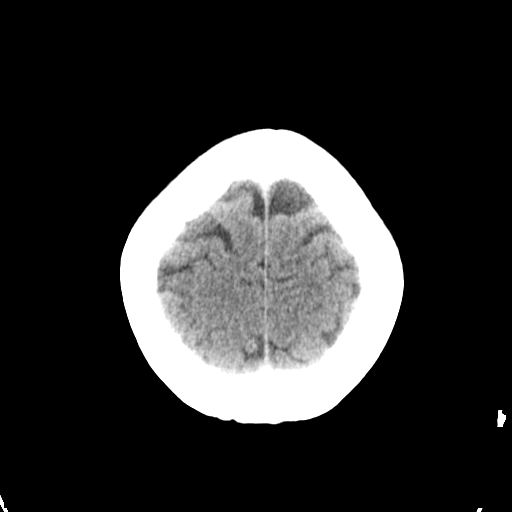
[im 26/30  brain]
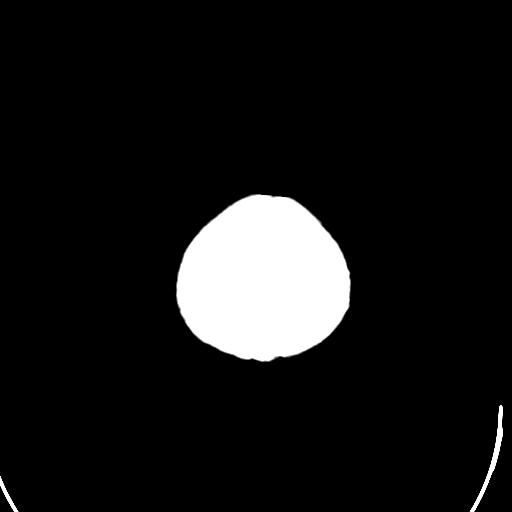

[Series 7: head bone recons · axial · 0.43mm/px · z∈[+702,+721]mm · 2 of 31 slices shown]
[im 4/31  bone]
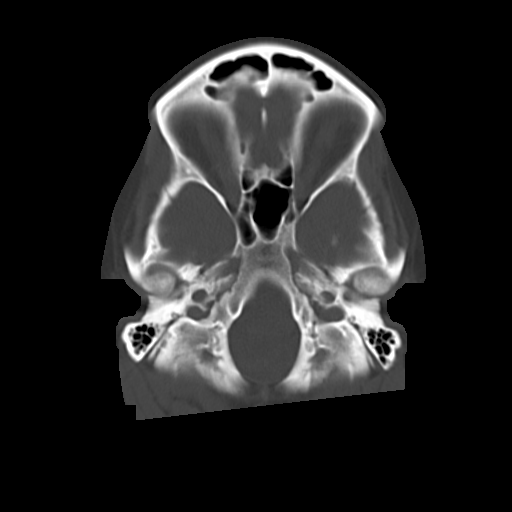
[im 8/31  bone]
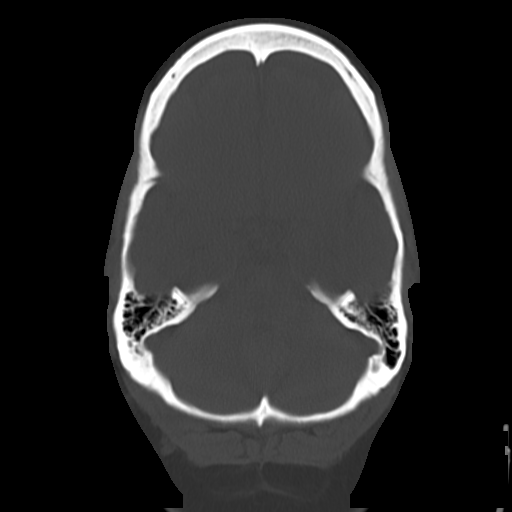

[17 of 30 positions shown; findings below may reference images not displayed]

FINDINGS: There is no intracranial hemorrhage, mass or evidence of acute
infarction. There is slight generalized atrophy with mild expansion
of the CSF spaces around the frontal lobes. This is unchanged. No
acute intracranial findings are evident. Gray matter and white
matter are unremarkable, with normal differentiation. No bone
abnormalities evident. Visible paranasal sinuses are clear.
IMPRESSION: No acute intracranial findings.  Mild generalized atrophy.

## 2016-03-03 ENCOUNTER — Emergency Department: Payer: Self-pay

## 2016-03-03 ENCOUNTER — Encounter: Payer: Self-pay | Admitting: *Deleted

## 2016-03-03 ENCOUNTER — Inpatient Hospital Stay
Admission: EM | Admit: 2016-03-03 | Discharge: 2016-03-05 | DRG: 440 | Disposition: A | Discharge status: Home / Self Care | Payer: Self-pay | Attending: Internal Medicine | Admitting: Internal Medicine

## 2016-03-03 DIAGNOSIS — I10 Essential (primary) hypertension: Secondary | ICD-10-CM | POA: Diagnosis present

## 2016-03-03 DIAGNOSIS — Z820 Family history of epilepsy and other diseases of the nervous system: Secondary | ICD-10-CM

## 2016-03-03 DIAGNOSIS — K859 Acute pancreatitis without necrosis or infection, unspecified: Principal | ICD-10-CM | POA: Diagnosis present

## 2016-03-03 DIAGNOSIS — E876 Hypokalemia: Secondary | ICD-10-CM | POA: Diagnosis present

## 2016-03-03 DIAGNOSIS — Z87442 Personal history of urinary calculi: Secondary | ICD-10-CM

## 2016-03-03 DIAGNOSIS — R1032 Left lower quadrant pain: Secondary | ICD-10-CM

## 2016-03-03 DIAGNOSIS — R748 Abnormal levels of other serum enzymes: Secondary | ICD-10-CM | POA: Diagnosis present

## 2016-03-03 DIAGNOSIS — Z8632 Personal history of gestational diabetes: Secondary | ICD-10-CM

## 2016-03-03 DIAGNOSIS — F17200 Nicotine dependence, unspecified, uncomplicated: Secondary | ICD-10-CM | POA: Diagnosis present

## 2016-03-03 DIAGNOSIS — E1165 Type 2 diabetes mellitus with hyperglycemia: Secondary | ICD-10-CM | POA: Diagnosis present

## 2016-03-03 DIAGNOSIS — E785 Hyperlipidemia, unspecified: Secondary | ICD-10-CM

## 2016-03-03 DIAGNOSIS — E781 Pure hyperglyceridemia: Secondary | ICD-10-CM | POA: Diagnosis present

## 2016-03-03 DIAGNOSIS — Z888 Allergy status to other drugs, medicaments and biological substances status: Secondary | ICD-10-CM

## 2016-03-03 HISTORY — DX: Type 2 diabetes mellitus without complications: E11.9

## 2016-03-03 LAB — COMPREHENSIVE METABOLIC PANEL
ALBUMIN: UNDETERMINED g/dL (ref 3.5–5.0)
ALBUMIN: UNDETERMINED g/dL (ref 3.5–5.0)
ALBUMIN: 3.2 g/dL — AB (ref 3.5–5.0)
ALK PHOS: 70 U/L (ref 38–126)
ALK PHOS: 67 U/L (ref 38–126)
ALT: UNDETERMINED U/L (ref 14–54)
ALT: UNDETERMINED U/L (ref 14–54)
ALT: 26 U/L (ref 14–54)
AST: 26 U/L (ref 15–41)
AST: 19 U/L (ref 15–41)
AST: 20 U/L (ref 15–41)
Alkaline Phosphatase: 82 U/L (ref 38–126)
Anion gap: 14 (ref 5–15)
BILIRUBIN TOTAL: 0.9 mg/dL (ref 0.3–1.2)
BILIRUBIN TOTAL: 0.9 mg/dL (ref 0.3–1.2)
BILIRUBIN TOTAL: 1 mg/dL (ref 0.3–1.2)
BUN: 6 mg/dL (ref 6–20)
BUN: 6 mg/dL (ref 6–20)
BUN: <5 mg/dL — ABNORMAL LOW (ref 6–20)
CO2: UNDETERMINED mmol/L (ref 22–32)
CO2: UNDETERMINED mmol/L (ref 22–32)
CO2: 18 mmol/L — AB (ref 22–32)
Calcium: UNDETERMINED mg/dL (ref 8.9–10.3)
Calcium: UNDETERMINED mg/dL (ref 8.9–10.3)
Calcium: 8.5 mg/dL — ABNORMAL LOW (ref 8.9–10.3)
Chloride: 98 mmol/L — ABNORMAL LOW (ref 101–111)
Chloride: 103 mmol/L (ref 101–111)
Chloride: 105 mmol/L (ref 101–111)
Creatinine, Ser: 0.52 mg/dL (ref 0.44–1.00)
Creatinine, Ser: 0.55 mg/dL (ref 0.44–1.00)
Creatinine, Ser: 0.63 mg/dL (ref 0.44–1.00)
GFR calc Af Amer: >60 mL/min (ref >60)
GFR calc Af Amer: >60 mL/min (ref >60)
GFR calc Af Amer: >60 mL/min (ref >60)
GFR calc non Af Amer: >60 mL/min (ref >60)
GFR calc non Af Amer: >60 mL/min (ref >60)
GFR calc non Af Amer: >60 mL/min (ref >60)
GLUCOSE: UNDETERMINED mg/dL (ref 65–99)
GLUCOSE: UNDETERMINED mg/dL (ref 65–99)
GLUCOSE: 314 mg/dL — AB (ref 65–99)
POTASSIUM: UNDETERMINED mmol/L (ref 3.5–5.1)
POTASSIUM: UNDETERMINED mmol/L (ref 3.5–5.1)
Potassium: 4.1 mmol/L (ref 3.5–5.1)
SODIUM: UNDETERMINED mmol/L (ref 135–145)
SODIUM: UNDETERMINED mmol/L (ref 135–145)
SODIUM: 137 mmol/L (ref 135–145)
TOTAL PROTEIN: UNDETERMINED g/dL (ref 6.5–8.1)
TOTAL PROTEIN: UNDETERMINED g/dL (ref 6.5–8.1)
Total Protein: 5.6 g/dL — ABNORMAL LOW (ref 6.5–8.1)

## 2016-03-03 LAB — URINALYSIS COMPLETE WITH MICROSCOPIC (ARMC ONLY)
Bilirubin Urine: NEGATIVE
Glucose, UA: >500 mg/dL — AB
NITRITE: NEGATIVE
PH: 5 (ref 5.0–8.0)
PROTEIN: NEGATIVE mg/dL
Specific Gravity, Urine: 1.031 — ABNORMAL HIGH (ref 1.005–1.030)

## 2016-03-03 LAB — CBC
HEMATOCRIT: 33.7 % — AB (ref 35.0–47.0)
HEMOGLOBIN: 12.1 g/dL (ref 12.0–16.0)
MCH: 29.9 pg (ref 26.0–34.0)
MCHC: 35.9 g/dL (ref 32.0–36.0)
MCV: 83.3 fL (ref 80.0–100.0)
Platelets: 205 10*3/uL (ref 150–440)
RBC: 4.05 MIL/uL (ref 3.80–5.20)
RDW: 14 % (ref 11.5–14.5)
WBC: 9.5 10*3/uL (ref 3.6–11.0)

## 2016-03-03 LAB — GLUCOSE, CAPILLARY
GLUCOSE-CAPILLARY: 318 mg/dL — AB (ref 65–99)
Glucose-Capillary: 306 mg/dL — ABNORMAL HIGH (ref 65–99)
Glucose-Capillary: 306 mg/dL — ABNORMAL HIGH (ref 65–99)

## 2016-03-03 LAB — TROPONIN I

## 2016-03-03 LAB — CHOLESTEROL, TOTAL: Cholesterol: 462 mg/dL — ABNORMAL HIGH (ref 0–200)

## 2016-03-03 LAB — TRIGLYCERIDES: TRIGLYCERIDES: 4469 mg/dL — AB (ref <150)

## 2016-03-03 LAB — LIPASE, BLOOD
LIPASE: UNDETERMINED U/L (ref 11–51)
LIPASE: 38 U/L (ref 11–51)
Lipase: UNDETERMINED U/L (ref 11–51)
Lipase: 78 U/L — ABNORMAL HIGH (ref 11–51)

## 2016-03-03 LAB — POCT PREGNANCY, URINE: PREG TEST UR: NEGATIVE

## 2016-03-03 MED ORDER — PANTOPRAZOLE SODIUM 40 MG IV SOLR
40.0000 mg | INTRAVENOUS | Status: DC
  Administered 2016-03-03 – 2016-03-04 (×2): 40 mg via INTRAVENOUS
  Filled 2016-03-03 (×2): qty 40

## 2016-03-03 MED ORDER — ONDANSETRON HCL 4 MG/2ML IJ SOLN
4.0000 mg | Freq: Once | INTRAMUSCULAR | Status: AC
Start: 2016-03-03 — End: 2016-03-03
  Administered 2016-03-03: 4 mg via INTRAVENOUS
  Filled 2016-03-03: qty 2

## 2016-03-03 MED ORDER — ONDANSETRON HCL 4 MG/2ML IJ SOLN
4.0000 mg | Freq: Four times a day (QID) | INTRAMUSCULAR | Status: DC | PRN
  Filled 2016-03-03: qty 2

## 2016-03-03 MED ORDER — METOPROLOL TARTRATE 50 MG PO TABS
50.0000 mg | ORAL_TABLET | Freq: Two times a day (BID) | ORAL | Status: DC
  Administered 2016-03-03 – 2016-03-05 (×4): 50 mg via ORAL
  Filled 2016-03-03 (×4): qty 1

## 2016-03-03 MED ORDER — HYDRALAZINE HCL 20 MG/ML IJ SOLN
10.0000 mg | Freq: Four times a day (QID) | INTRAMUSCULAR | Status: DC | PRN
  Administered 2016-03-03: 10 mg via INTRAVENOUS
  Filled 2016-03-03: qty 1

## 2016-03-03 MED ORDER — KETOROLAC TROMETHAMINE 30 MG/ML IJ SOLN
30.0000 mg | Freq: Four times a day (QID) | INTRAMUSCULAR | Status: DC
  Administered 2016-03-03 – 2016-03-05 (×8): 30 mg via INTRAVENOUS
  Filled 2016-03-03 (×13): qty 1

## 2016-03-03 MED ORDER — ONDANSETRON HCL 4 MG/2ML IJ SOLN
4.0000 mg | Freq: Once | INTRAMUSCULAR | Status: AC
  Administered 2016-03-03: 4 mg via INTRAVENOUS

## 2016-03-03 MED ORDER — ONDANSETRON HCL 4 MG/2ML IJ SOLN
INTRAMUSCULAR | Status: AC
  Administered 2016-03-03: 4 mg
  Filled 2016-03-03: qty 2

## 2016-03-03 MED ORDER — HYDROMORPHONE HCL 1 MG/ML IJ SOLN
1.0000 mg | Freq: Once | INTRAMUSCULAR | Status: AC
  Administered 2016-03-03: 1 mg via INTRAVENOUS
  Filled 2016-03-03: qty 1

## 2016-03-03 MED ORDER — SODIUM CHLORIDE 0.9 % IV BOLUS (SEPSIS)
1000.0000 mL | Freq: Once | INTRAVENOUS | Status: AC
Start: 2016-03-03 — End: 2016-03-03
  Administered 2016-03-03: 1000 mL via INTRAVENOUS

## 2016-03-03 MED ORDER — INSULIN ASPART 100 UNIT/ML ~~LOC~~ SOLN
0.0000 [IU] | Freq: Three times a day (TID) | SUBCUTANEOUS | Status: DC
  Administered 2016-03-04: 5 [IU] via SUBCUTANEOUS
  Filled 2016-03-03: qty 5

## 2016-03-03 MED ORDER — ENOXAPARIN SODIUM 40 MG/0.4ML ~~LOC~~ SOLN
40.0000 mg | SUBCUTANEOUS | Status: DC
  Administered 2016-03-03 – 2016-03-05 (×3): 40 mg via SUBCUTANEOUS
  Filled 2016-03-03 (×3): qty 0.4

## 2016-03-03 MED ORDER — INSULIN ASPART 100 UNIT/ML ~~LOC~~ SOLN
0.0000 [IU] | SUBCUTANEOUS | Status: DC
  Administered 2016-03-03: 16 [IU] via SUBCUTANEOUS
  Filled 2016-03-03: qty 16

## 2016-03-03 MED ORDER — MORPHINE SULFATE (PF) 4 MG/ML IV SOLN
4.0000 mg | Freq: Once | INTRAVENOUS | Status: AC
  Administered 2016-03-03: 4 mg via INTRAVENOUS
  Filled 2016-03-03: qty 1

## 2016-03-03 MED ORDER — BARIUM SULFATE 2.1 % PO SUSP
450.0000 mL | ORAL | Status: AC
  Administered 2016-03-03: 450 mL via ORAL

## 2016-03-03 MED ORDER — IOHEXOL 240 MG/ML SOLN: 25.0000 mL | Freq: Once | INTRAMUSCULAR | Status: DC | PRN

## 2016-03-03 MED ORDER — MORPHINE SULFATE (PF) 2 MG/ML IV SOLN
2.0000 mg | INTRAVENOUS | Status: DC | PRN
  Administered 2016-03-03 – 2016-03-04 (×2): 2 mg via INTRAVENOUS
  Filled 2016-03-03 (×2): qty 1

## 2016-03-03 MED ORDER — PROMETHAZINE HCL 25 MG/ML IJ SOLN
25.0000 mg | Freq: Four times a day (QID) | INTRAMUSCULAR | Status: DC | PRN
  Administered 2016-03-03: 25 mg via INTRAVENOUS
  Filled 2016-03-03: qty 1

## 2016-03-03 MED ORDER — SODIUM CHLORIDE 0.9 % IV SOLN
INTRAVENOUS | Status: DC
  Administered 2016-03-03 – 2016-03-04 (×3): via INTRAVENOUS

## 2016-03-03 NOTE — ED Provider Notes (Signed)
[facility] Emergency Department Provider Note  ____________________________________________  Time seen: Approximately 850 AM  I have reviewed the triage vital signs and the nursing notes.   HISTORY  Chief Complaint Abdominal Pain    HPI Ashley Snow is a 45 y.o. female with a history of hypertension and diabetes who is presenting to the emergency department today for left-sided abdominal pain which has been worsening since yesterday. She says the pain is throbbing and a 7 out of 10 right now. She says it is radiating to her left back. Says she has a history of kidney stones as well as urinary tract infections. However, she denies any urinary frequency or burning with urination. Says that she has nausea but no vomiting. Says that she also has a history of diverticulitis but has not had any issues stooling lately. Says that she had chills at home but no fever.   Past Medical History  Diagnosis Date  . Hypertension   . Diabetes mellitus without complication [facility])     Patient Active Problem List   Diagnosis Date Noted  . Headache 10/02/2015    Past Surgical History  Procedure Laterality Date  . Cesarean section      Current Outpatient Rx  Name  Route  Sig  Dispense  Refill  . ferrous sulfate 325 (65 FE) MG tablet   Oral   Take 325 mg by mouth daily.         . hydrochlorothiazide (HYDRODIURIL) 25 MG tablet   Oral   Take 1 tablet (25 mg total) by mouth daily.   30 tablet   0   . ibuprofen (ADVIL,MOTRIN) 200 MG tablet   Oral   Take 400-600 mg by mouth every 6 (six) hours as needed for mild pain or moderate pain.         Marland Kitchen lisinopril (PRINIVIL,ZESTRIL) 20 MG tablet   Oral   Take 1 tablet (20 mg total) by mouth daily.   30 tablet   0     Allergies Iodine  Family History  Problem Relation Age of Onset  . Dementia Mother     Social History Social History  Substance Use Topics  . Smoking status: Current Every Day Smoker  .  Smokeless tobacco: None  . Alcohol Use: Yes    Review of Systems Constitutional: No fever/chills Eyes: No visual changes. ENT: No sore throat. Cardiovascular: Denies chest pain. Respiratory: Denies shortness of breath. Gastrointestinal:  no vomiting.  No diarrhea.  No constipation. Genitourinary: Negative for dysuria. Musculoskeletal: Negative for back pain. Skin: Negative for rash. Neurological: Negative for headaches, focal weakness or numbness.  10-point ROS otherwise negative.  ____________________________________________   PHYSICAL EXAM:  VITAL SIGNS: ED Triage Vitals  Enc Vitals Group     BP 03/03/16 0748 174/104 mmHg     Pulse Rate 03/03/16 0748 99     Resp 03/03/16 0748 18     Temp 03/03/16 0748 98.1 F (36.7 C)     Temp Source 03/03/16 0748 Oral     SpO2 --      Weight 03/03/16 0746 240 lb (108.863 kg)     Height 03/03/16 0746  (1.727 m)     Head Cir --      Peak Flow --      Pain Score 03/03/16 0747 7     Pain Loc --      Pain Edu? --      Excl. in GC? --     Constitutional: Alert  and oriented. Well appearing and in no acute distress. Eyes: Conjunctivae are normal. PERRL. EOMI. Head: Atraumatic. Nose: No congestion/rhinnorhea. Mouth/Throat: Mucous membranes are moist.   Neck: No stridor.   Cardiovascular: Normal rate, regular rhythm. Grossly normal heart sounds.   Respiratory: Normal respiratory effort.  No retractions. Lungs CTAB. Gastrointestinal: Soft with diffuse abdominal tenderness which is worse to the epigastrium as well as left upper quadrant but does have right upper quadrant as well as right lower quadrant tenderness as well. No distention. Left-sided CVA tenderness to palpation. Musculoskeletal: No lower extremity tenderness nor edema.  No joint effusions. Neurologic:  Normal speech and language. No gross focal neurologic deficits are appreciated.  Skin:  Skin is warm, dry and intact. No rash noted. Psychiatric: Mood and affect are  normal. Speech and behavior are normal.  ____________________________________________   LABS (all labs ordered are listed, but only abnormal results are displayed)  Labs Reviewed  COMPREHENSIVE METABOLIC PANEL - Abnormal; Notable for the following:    Chloride 98 (*)    All other components within normal limits  CBC - Abnormal; Notable for the following:    HCT 33.7 (*)    All other components within normal limits  URINALYSIS COMPLETEWITH MICROSCOPIC ([facility] ONLY) - Abnormal; Notable for the following:    Color, Urine YELLOW (*)    APPearance CLOUDY (*)    Glucose, UA >500 (*)    Ketones, ur 2+ (*)    Specific Gravity, Urine 1.031 (*)    Hgb urine dipstick 1+ (*)    Leukocytes, UA TRACE (*)    Bacteria, UA RARE (*)    Squamous Epithelial / LPF 6-30 (*)    All other components within normal limits  GLUCOSE, CAPILLARY - Abnormal; Notable for the following:    Glucose-Capillary 306 (*)    All other components within normal limits  CHOLESTEROL, TOTAL - Abnormal; Notable for the following:    Cholesterol 462 (*)    All other components within normal limits  LIPASE, BLOOD  LIPASE, BLOOD  COMPREHENSIVE METABOLIC PANEL  COMPREHENSIVE METABOLIC PANEL  LIPASE, BLOOD  TROPONIN I  TRIGLYCERIDES  POC URINE PREG, ED  POCT PREGNANCY, URINE   ____________________________________________  EKG  ED ECG REPORT I, Joshuajames Moehring,  Teena Irani, the attending physician, personally viewed and interpreted this ECG.   Date: 03/03/2016  EKG Time: 752  Rate: 95  Rhythm: normal sinus rhythm  Axis: Normal  Intervals:none  ST&T Change: No ST segment elevation or depression. No abnormal T-wave inversion.  ____________________________________________  RADIOLOGY  IMPRESSION: 1. Mild hazy inflammatory change around the ventral aspect of the pancreatic head adjacent to the duodenum. This may reflect mild pancreatitis versus mild duodenitis.   Electronically Signed By: Elige Ko On:  03/03/2016 11:09 ____________________________________________   PROCEDURES   ____________________________________________   INITIAL IMPRESSION / ASSESSMENT AND PLAN / ED COURSE  Pertinent labs & imaging results that were available during my care of the patient were reviewed by me and considered in my medical decision making (see chart for details).  ----------------------------------------- 1:44 PM on 03/03/2016 -----------------------------------------  Patient still requiring pain meds. Has been given 2 doses of morphine thus far and still is having moderate to severe pain. We'll give a dose of Dilaudid. At inflammatory changes adjacent to her pancreas as well as duodenum. I discussed this with the patient as well as my thought that this is likely related to hypertriglyceridemia. Says that she is known to have hypertriglyceridemia but is been off meds for several  years this time. She also says she is not seen a doctor in the office in several years. Because of her pain medication requirement as well as pending labs which cannot be read because of the lipids she'll be admitted to the hospital for further management. Signed out to Dr. Pablo LawrenceKonadena. ____________________________________________   FINAL CLINICAL IMPRESSION(S) / ED DIAGNOSES  Final diagnoses:  LLQ pain   pancreatitis. Hyperlipidemia.    Myrna Blazeravid Matthew Azariel Banik, MD 03/03/16 1346

## 2016-03-03 NOTE — Plan of Care (Signed)
K pad in place

## 2016-03-03 NOTE — Progress Notes (Signed)
Spoke with Dr. Luberta MutterKonidena because patient is requesting something to eat. Order received for clear liquid diet.

## 2016-03-03 NOTE — ED Notes (Signed)
Pt complains of left sided abdominal pain starting yesterday with nausea

## 2016-03-03 NOTE — Plan of Care (Signed)
Pt continues to have high BP 160-70/low 100s. PRN meds have been given, but pt indicates that HTN is extremely normal for her and pain only makes it worse.  Pt remains asymptomatic otherwise.  RN will continue to monitor.

## 2016-03-03 NOTE — ED Notes (Signed)
EKG exported by tech, Elita QuickPam

## 2016-03-03 NOTE — H&P (Signed)
[facility] [place] [facility]   PATIENT NAME: Ashley ChanceMelissa Snow    MR#:  [id number]  DATE OF BIRTH:  March 04, 1971  DATE OF ADMISSION:  03/03/2016  PRIMARY CARE PHYSICIAN: No PCP Per Patient   REQUESTING/REFERRING PHYSICIAN: Dr. Langston MaskerShaevitz  CHIEF COMPLAINT: Abdominal pain    Chief Complaint  Patient presents with  . Abdominal Pain    HISTORY OF PRESENT ILLNESS:  Ashley Snow  is a 45 y.o. female with a known history of hypertension, hypertriglyceridemia comes in because of abdominal pain. Patient started having abdominal pain since yesterday evening, says that about pain is generalized associated with nausea. Abdominal pain is progressively worse today morning. Pain mainly concentrated on left upper quadrant pain and then goes all over the abdomen. No failure. No diarrhea. Patient has history of  high triglycerides up to 3000 range around the and she was 45 year old. Took the medicine TriCor, fish oil for 2 years and then she stopped it. Her pain is 10 out of 10 severity requiring multiple  Doses of IV pain medications in the ER.  PAST MEDICAL HISTORY:   Past Medical History  Diagnosis Date  . Hypertension   . Diabetes mellitus without complication (HCC)     PAST SURGICAL HISTOIRY:   Past Surgical History  Procedure Laterality Date  . Cesarean section      SOCIAL HISTORY:   Social History  Substance Use Topics  . Smoking status: Current Every Day Smoker  . Smokeless tobacco: Not on file  . Alcohol Use: Yes    FAMILY HISTORY:   Family History  Problem Relation Age of Onset  . Dementia Mother     DRUG ALLERGIES:   Allergies  Allergen Reactions  . Iodine Rash    REVIEW OF SYSTEMS:  CONSTITUTIONAL: No fever, fatigue or weakness.  EYES: No blurred or double vision.  EARS, NOSE, AND THROAT: No tinnitus or ear pain.  RESPIRATORY: No cough, shortness of breath, wheezing or hemoptysis.  CARDIOVASCULAR: No chest pain, orthopnea, edema.   GASTROINTESTINAL: Midepigastric and left upper quadrant pain radiating to the back associated with nausea. GENITOURINARY: No dysuria, hematuria.  ENDOCRINE: No polyuria, nocturia,  HEMATOLOGY: No anemia, easy bruising or bleeding SKIN: No rash or lesion. MUSCULOSKELETAL: No joint pain or arthritis.   NEUROLOGIC: No tingling, numbness, weakness.  PSYCHIATRY: No anxiety or depression.   MEDICATIONS AT HOME:   Prior to Admission medications   Medication Sig Start Date End Date Taking? Authorizing Provider  ferrous sulfate 325 (65 FE) MG tablet Take 325 mg by mouth daily.   Yes Historical Provider, MD  hydrochlorothiazide (HYDRODIURIL) 25 MG tablet Take 1 tablet (25 mg total) by mouth daily. 10/03/15  Yes Shaune PollackQing Chen, MD  ibuprofen (ADVIL,MOTRIN) 200 MG tablet Take 400-600 mg by mouth every 6 (six) hours as needed for mild pain or moderate pain.   Yes Historical Provider, MD  lisinopril (PRINIVIL,ZESTRIL) 20 MG tablet Take 1 tablet (20 mg total) by mouth daily. 10/03/15  Yes Shaune PollackQing Chen, MD      VITAL SIGNS:  Blood pressure 167/101, pulse 86, temperature 98.1 F (36.7 C), temperature source Oral, resp. rate 17, height 5\' 8"  (1.727 m), weight 108.863 kg (240 lb), last menstrual period 02/24/2016, SpO2 97 %.  PHYSICAL EXAMINATION:  GENERAL:  45 y.o.-year-old patient lying in the bed with no acute distress.  EYES: Pupils equal, round, reactive to light and accommodation. No scleral icterus. Extraocular muscles intact.  HEENT: Head atraumatic, normocephalic. Oropharynx and nasopharynx clear.  NECK:  Supple, no jugular venous distention. No thyroid enlargement, no tenderness.  LUNGS: Normal breath sounds bilaterally, no wheezing, rales,rhonchi or crepitation. No use of accessory muscles of respiration.  CARDIOVASCULAR: S1, S2 normal. No murmurs, rubs, or gallops.  ABDOMEN: Generalized abdominal tenderness present .no rebound tenderness/ no organomegaly  EXTREMITIES: No pedal edema, cyanosis, or  clubbing.  NEUROLOGIC: Cranial nerves II through XII are intact. Muscle strength 5/5 in all extremities. Sensation intact. Gait not checked.  PSYCHIATRIC: The patient is alert and oriented x 3.  SKIN: No obvious rash, lesion, or ulcer.   LABORATORY PANEL:   CBC  Recent Labs Lab 03/03/16 0824  WBC 9.5  HGB 12.1  HCT 33.7*  PLT 205   ------------------------------------------------------------------------------------------------------------------  Chemistries   Recent Labs Lab 03/03/16 0934  NA UNABLE TO REPORT DUE TO LIPEMIC INTERFERENCE  K UNABLE TO REPORT DUE TO LIPEMIC INTERFERENCE  CL 103  CO2 UNABLE TO REPORT DUE TO LIPEMIC INTERFERENCE  GLUCOSE UNABLE TO REPORT DUE TO LIPEMIC INTERFERENCE  BUN 6  CREATININE 0.55  CALCIUM UNABLE TO REPORT DUE TO LIPEMIC INTERFERENCE  AST 19  ALT UNABLE TO REPORT DUE TO LIPEMIC INTERFERENCE  ALKPHOS 70  BILITOT 0.9   ------------------------------------------------------------------------------------------------------------------  Cardiac Enzymes No results for input(s): TROPONINI in the last 168 hours. ------------------------------------------------------------------------------------------------------------------  RADIOLOGY:  Ct Abdomen Pelvis Wo Contrast  03/03/2016  CLINICAL DATA:  Left lower quadrant pain. EXAM: CT ABDOMEN AND PELVIS WITHOUT CONTRAST TECHNIQUE: Multidetector CT imaging of the abdomen and pelvis was performed following the standard protocol without IV contrast. COMPARISON:  None. FINDINGS: Lower chest:  Clear lung bases.  Normal heart size. Hepatobiliary: Diffuse low attenuation of the liver as can be seen with hepatic steatosis. No hepatic mass. Cholelithiasis. Pancreas: Mild hazy inflammatory change around the ventral aspect of the pancreatic head adjacent to the duodenum. Spleen: Normal. Adrenals/Urinary Tract: Normal adrenal glands. Normal kidneys. No urolithiasis or obstructive uropathy. Normal bladder.  Stomach/Bowel: No bowel wall thickening or bowel dilatation. No pneumatosis, pneumoperitoneum or portal venous gas. No abdominal or pelvic free fluid. Normal appendix. Vascular/Lymphatic: Normal caliber abdominal aorta. No lymphadenopathy. Reproductive: Normal uterus.  No adnexal mass. Other: No fluid collection or hematoma. Musculoskeletal: No lytic or sclerotic osseous lesion. No acute osseous abnormality. IMPRESSION: 1. Mild hazy inflammatory change around the ventral aspect of the pancreatic head adjacent to the duodenum. This may reflect mild pancreatitis versus mild duodenitis. Electronically Signed   By: Elige Ko   On: 03/03/2016 11:09    EKG:   Orders placed or performed during the hospital encounter of 03/03/16  . EKG 12-Lead  . EKG 12-Lead  . ED EKG within 10 minutes  . ED EKG within 10 minutes   normal sinus rhythm at 70 bpm no ST-T changes.  IMPRESSION AND PLAN:   #1 abdominal pain secondary to acute pancreatitis: CT evidence of pancreatitis, patient may have severe hypertriglyceridemia. Unable to process the lab samples because of lipemic specimen so the labs are sent to [facility]. Admit the patient to hospitalist service for acute pancreatitis, start IV hydration, IV pain medications, IV Zofran and IV PPIs. Start clear liquids if she tolerates. #2 diabetes mellitus type 2: No prior diagnosis. Patient will get hemoglobin A1c checked. #3 hypertension: The patient is elevated due to by abdominal pain. Use  IV hydralazine, pain medications and restart the by mouth hypertensive medications from tomorrow.    All the records are reviewed and case discussed with ED provider. Management plans discussed with the patient,  family and they are in agreement.  CODE STATUS: full  TOTAL TIME TAKING CARE OF THIS PATIENT: .    Katha Hamming M.D on 03/03/2016 at 2:09 PM  Between 7am to 6pm - Pager - [phone]  After 6pm go to [email or web address] - password [email or web address] [facility]  [place]  [facility]  Office  [phone]  CC: Primary care physician; No PCP Per Patient  Note: This dictation was prepared with Dragon dictation along with smaller phrase technology. Any transcriptional errors that result from this process are unintentional.

## 2016-03-04 LAB — COMPREHENSIVE METABOLIC PANEL
ALBUMIN: 3.3 g/dL — AB (ref 3.5–5.0)
ALK PHOS: 70 U/L (ref 38–126)
ALT: 21 U/L (ref 14–54)
ANION GAP: 7 (ref 5–15)
AST: 17 U/L (ref 15–41)
BUN: <5 mg/dL — ABNORMAL LOW (ref 6–20)
CALCIUM: 7.8 mg/dL — AB (ref 8.9–10.3)
CO2: 19 mmol/L — AB (ref 22–32)
Chloride: 104 mmol/L (ref 101–111)
Creatinine, Ser: 0.54 mg/dL (ref 0.44–1.00)
GFR calc non Af Amer: >60 mL/min (ref >60)
GLUCOSE: 244 mg/dL — AB (ref 65–99)
POTASSIUM: 3.5 mmol/L (ref 3.5–5.1)
SODIUM: 130 mmol/L — AB (ref 135–145)
TOTAL PROTEIN: 6.3 g/dL — AB (ref 6.5–8.1)
Total Bilirubin: 0.8 mg/dL (ref 0.3–1.2)

## 2016-03-04 LAB — CBC
HEMATOCRIT: 32 % — AB (ref 35.0–47.0)
HEMOGLOBIN: 10.8 g/dL — AB (ref 12.0–16.0)
MCH: 28.7 pg (ref 26.0–34.0)
MCHC: 33.7 g/dL (ref 32.0–36.0)
MCV: 85.2 fL (ref 80.0–100.0)
Platelets: 190 10*3/uL (ref 150–440)
RBC: 3.75 MIL/uL — AB (ref 3.80–5.20)
RDW: 13.8 % (ref 11.5–14.5)
WBC: 9.2 10*3/uL (ref 3.6–11.0)

## 2016-03-04 LAB — TRIGLYCERIDES: TRIGLYCERIDES: 1269 mg/dL — AB (ref <150)

## 2016-03-04 LAB — GLUCOSE, CAPILLARY
GLUCOSE-CAPILLARY: 234 mg/dL — AB (ref 65–99)
GLUCOSE-CAPILLARY: 263 mg/dL — AB (ref 65–99)
GLUCOSE-CAPILLARY: 245 mg/dL — AB (ref 65–99)
Glucose-Capillary: 241 mg/dL — ABNORMAL HIGH (ref 65–99)
Glucose-Capillary: 234 mg/dL — ABNORMAL HIGH (ref 65–99)
Glucose-Capillary: 166 mg/dL — ABNORMAL HIGH (ref 65–99)
Glucose-Capillary: 167 mg/dL — ABNORMAL HIGH (ref 65–99)

## 2016-03-04 LAB — HEMOGLOBIN A1C
HEMOGLOBIN A1C: 10.9 % — AB (ref 4.0–6.0)
HEMOGLOBIN A1C: 10.8 % — AB (ref 4.0–6.0)

## 2016-03-04 LAB — MRSA PCR SCREENING: MRSA by PCR: NEGATIVE

## 2016-03-04 MED ORDER — GLIPIZIDE 10 MG PO TABS
5.0000 mg | ORAL_TABLET | Freq: Every day | ORAL | Status: DC
  Administered 2016-03-05: 5 mg via ORAL
  Filled 2016-03-04: qty 1

## 2016-03-04 MED ORDER — INSULIN ASPART 100 UNIT/ML ~~LOC~~ SOLN
0.0000 [IU] | Freq: Three times a day (TID) | SUBCUTANEOUS | Status: DC
  Administered 2016-03-04 – 2016-03-05 (×3): 2 [IU] via SUBCUTANEOUS
  Filled 2016-03-04 (×3): qty 2

## 2016-03-04 MED ORDER — MORPHINE SULFATE (PF) 4 MG/ML IV SOLN
4.0000 mg | INTRAVENOUS | Status: DC | PRN
  Administered 2016-03-04: 4 mg via INTRAVENOUS
  Filled 2016-03-04: qty 1

## 2016-03-04 MED ORDER — OXYCODONE HCL 5 MG PO TABS
5.0000 mg | ORAL_TABLET | ORAL | Status: DC | PRN
  Administered 2016-03-04: 5 mg via ORAL
  Filled 2016-03-04 (×2): qty 1

## 2016-03-04 MED ORDER — POTASSIUM CHLORIDE IN NACL 20-0.9 MEQ/L-% IV SOLN
INTRAVENOUS | Status: DC
  Administered 2016-03-04 – 2016-03-05 (×3): via INTRAVENOUS
  Filled 2016-03-04 (×5): qty 1000

## 2016-03-04 MED ORDER — ACETAMINOPHEN 325 MG PO TABS
650.0000 mg | ORAL_TABLET | Freq: Four times a day (QID) | ORAL | Status: DC | PRN
  Administered 2016-03-04 (×2): 650 mg via ORAL
  Filled 2016-03-04 (×2): qty 2

## 2016-03-04 MED ORDER — METFORMIN HCL 500 MG PO TABS
500.0000 mg | ORAL_TABLET | Freq: Two times a day (BID) | ORAL | Status: DC
  Administered 2016-03-04 – 2016-03-05 (×2): 500 mg via ORAL
  Filled 2016-03-04 (×3): qty 1

## 2016-03-04 MED ORDER — SODIUM CHLORIDE 0.9 % IV SOLN
INTRAVENOUS | Status: DC
  Administered 2016-03-04: 12:00:00 via INTRAVENOUS
  Filled 2016-03-04: qty 2.5

## 2016-03-04 MED ORDER — GEMFIBROZIL 600 MG PO TABS
600.0000 mg | ORAL_TABLET | Freq: Two times a day (BID) | ORAL | Status: DC
  Administered 2016-03-04 – 2016-03-05 (×2): 600 mg via ORAL
  Filled 2016-03-04 (×5): qty 1

## 2016-03-04 MED ORDER — INSULIN ASPART 100 UNIT/ML ~~LOC~~ SOLN
0.0000 [IU] | Freq: Every day | SUBCUTANEOUS | Status: DC
  Administered 2016-03-04: 2 [IU] via SUBCUTANEOUS
  Filled 2016-03-04: qty 2

## 2016-03-04 MED ORDER — MORPHINE SULFATE (PF) 2 MG/ML IV SOLN: 2.0000 mg | INTRAVENOUS | Status: DC | PRN

## 2016-03-04 NOTE — Progress Notes (Signed)
Inpatient Diabetes Program Recommendations  AACE/ADA: New Consensus Statement on Inpatient Glycemic Control (2015)  Target Ranges:  Prepandial:   less than 140 mg/dL      Peak postprandial:   less than 180 mg/dL (1-2 hours)      Critically ill patients:  140 - 180 mg/dL   Met with the patient and her boyfriend at the bedside.  She reports that she was first diagnosed with gestational diabetes when she was 45yr old.  Blood sugar improved and she  Had elevated numbers at the age of 89at which time she was on Metformin and Glipizide.  She lost weight (was 300lbs) and came off the medication.  She has not had health care over the past couple of years and no healthcare provider. She does not have a meter.  Declined offer for Living Well with Diabetes or a dietitian consult.  I have requested a blood sugar meter and strips for the patient from [facility] Management so she can check blood sugars before she gets into [facility].   JGentry Fitz RN, BA, MHA, CDE Diabetes Coordinator Inpatient Diabetes Program  [phone](Team Pager) [phone] 03/04/2016 9:14 AM

## 2016-03-04 NOTE — Progress Notes (Addendum)
Called dietary to request diabetic clear liquid trays for patient.

## 2016-03-04 NOTE — Progress Notes (Signed)
Patient ID: Ashley Snow, female   DOB: 01/21/1971, 45 y.o.   MRN: [id number] [facility] PROGRESS NOTE  Ashley Snow WUJ:[id number] DOB: August 07, 1971 DOA: 03/03/2016 PCP: No PCP Per Patient  HPI/Subjective: Patient with moderate abdominal pain mostly left upper quadrant area. Patient states that she had gestational diabetes.  Objective: Filed Vitals:   03/04/16 0323 03/04/16 0715  BP: 149/93 126/68  Pulse: 94 95  Temp: 99.7 F (37.6 C) 98.5 F (36.9 C)  Resp: 19 18    Filed Weights   03/03/16 0746  Weight: 108.863 kg (240 lb)    ROS: Review of Systems  Constitutional: Negative for fever and chills.  Eyes: Negative for blurred vision.  Respiratory: Negative for cough and shortness of breath.   Cardiovascular: Negative for chest pain.  Gastrointestinal: Positive for nausea and abdominal pain. Negative for vomiting, diarrhea and constipation.  Genitourinary: Negative for dysuria.  Musculoskeletal: Negative for joint pain.  Neurological: Negative for dizziness and headaches.   Exam: Physical Exam  Constitutional: She is oriented to person, place, and time.  HENT:  Nose: No mucosal edema.  Mouth/Throat: No oropharyngeal exudate or posterior oropharyngeal edema.  Eyes: Conjunctivae, EOM and lids are normal. Pupils are equal, round, and reactive to light.  Neck: No JVD present. Carotid bruit is not present. No edema present. No thyroid mass and no thyromegaly present.  Cardiovascular: S1 normal and S2 normal.  Exam reveals no gallop.   No murmur heard. Pulses:      Dorsalis pedis pulses are 2+ on the right side, and 2+ on the left side.  Respiratory: No respiratory distress. She has no wheezes. She has no rhonchi. She has no rales.  GI: Soft. Bowel sounds are normal. There is tenderness in the epigastric area and left upper quadrant.  Musculoskeletal:       Right ankle: She exhibits no swelling.       Left ankle: She exhibits no swelling.  Lymphadenopathy:     She has no cervical adenopathy.  Neurological: She is alert and oriented to person, place, and time. No cranial nerve deficit.  Skin: Skin is warm. No rash noted. Nails show no clubbing.  Psychiatric: She has a normal mood and affect.    Data Reviewed: Basic Metabolic Panel:  Recent Labs Lab 03/03/16 0824 03/03/16 0934 03/03/16 1046 03/04/16 0411  NA UNABLE TO REPORT DUE TO LIPEMIC INTERFERENCE UNABLE TO REPORT DUE TO LIPEMIC INTERFERENCE 137 130*  K UNABLE TO REPORT DUE TO LIPEMIC INTERFERENCE UNABLE TO REPORT DUE TO LIPEMIC INTERFERENCE 4.1 3.5  CL 98* 103 105 104  CO2 UNABLE TO REPORT DUE TO LIPEMIC INTERFERENCE UNABLE TO REPORT DUE TO LIPEMIC INTERFERENCE 18* 19*  GLUCOSE UNABLE TO REPORT DUE TO LIPEMIC INTERFERENCE UNABLE TO REPORT DUE TO LIPEMIC INTERFERENCE 314* 244*  BUN 6 6 <5* <5*  CREATININE 0.52 0.55 0.63 0.54  CALCIUM UNABLE TO REPORT DUE TO LIPEMIC INTERFERENCE UNABLE TO REPORT DUE TO LIPEMIC INTERFERENCE 8.5* 7.8*   Liver Function Tests:  Recent Labs Lab 03/03/16 0824 03/03/16 0934 03/03/16 1046 03/04/16 0411  AST ALT UNABLE TO REPORT DUE TO LIPEMIC INTERFERENCE UNABLE TO REPORT DUE TO LIPEMIC INTERFERENCE 26 21  ALKPHOS 82 70 67 70  BILITOT 0.9 0.9 1.0 0.8  PROT UNABLE TO REPORT DUE TO LIPEMIC INTERFERENCE UNABLE TO REPORT DUE TO LIPEMIC INTERFERENCE 5.6* 6.3*  ALBUMIN UNABLE TO REPORT DUE TO LIPEMIC INTERFERENCE UNABLE TO REPORT DUE TO LIPEMIC INTERFERENCE 3.2* 3.3*  Recent Labs Lab 03/03/16 0824 03/03/16 0934 03/03/16 1046 03/03/16 1719  LIPASE UNABLE TO REPORT DUE TO LIPEMIC INTERFERENCE UNABLE TO REPORT DUE TO LIPEMIC INTERFERENCE 78* 38   CBC:  Recent Labs Lab 03/03/16 0824 03/04/16 0411  WBC 9.5 9.2  HGB 12.1 10.8*  HCT 33.7* 32.0*  MCV 83.3 85.2  PLT 205 190   Cardiac Enzymes:  Recent Labs Lab 03/03/16 0824  TROPONINI <0.03   BNP (last 3 results)  Recent Labs  04/25/15 0019  BNP 95    CBG:  Recent  Labs Lab 03/03/16 2128 03/04/16 0716 03/04/16 1056 03/04/16 1250 03/04/16 1413  GLUCAP 318* 234* 263* 241* 234*    Studies: Ct Abdomen Pelvis Wo Contrast  03/03/2016  CLINICAL DATA:  Left lower quadrant pain. EXAM: CT ABDOMEN AND PELVIS WITHOUT CONTRAST TECHNIQUE: Multidetector CT imaging of the abdomen and pelvis was performed following the standard protocol without IV contrast. COMPARISON:  None. FINDINGS: Lower chest:  Clear lung bases.  Normal heart size. Hepatobiliary: Diffuse low attenuation of the liver as can be seen with hepatic steatosis. No hepatic mass. Cholelithiasis. Pancreas: Mild hazy inflammatory change around the ventral aspect of the pancreatic head adjacent to the duodenum. Spleen: Normal. Adrenals/Urinary Tract: Normal adrenal glands. Normal kidneys. No urolithiasis or obstructive uropathy. Normal bladder. Stomach/Bowel: No bowel wall thickening or bowel dilatation. No pneumatosis, pneumoperitoneum or portal venous gas. No abdominal or pelvic free fluid. Normal appendix. Vascular/Lymphatic: Normal caliber abdominal aorta. No lymphadenopathy. Reproductive: Normal uterus.  No adnexal mass. Other: No fluid collection or hematoma. Musculoskeletal: No lytic or sclerotic osseous lesion. No acute osseous abnormality. IMPRESSION: 1. Mild hazy inflammatory change around the ventral aspect of the pancreatic head adjacent to the duodenum. This may reflect mild pancreatitis versus mild duodenitis. Electronically Signed   By: Ashley KoHetal  Snow   On: 03/03/2016 11:09    Scheduled Meds: . enoxaparin (LOVENOX) injection  40 mg Subcutaneous Q24H  . gemfibrozil  600 mg Oral BID AC  . [START ON 03/05/2016] glipiZIDE  5 mg Oral QAC breakfast  . ketorolac  30 mg Intravenous 4 times per day  . metFORMIN  500 mg Oral BID WC  . metoprolol tartrate  50 mg Oral BID  . pantoprazole (PROTONIX) IV  40 mg Intravenous Q24H   Continuous Infusions: . 0.9 % NaCl with KCl 20 mEq / L    . insulin (NOVOLIN-R)  infusion      Assessment/Plan:  1. Acute pancreatitis secondary to hypertriglyceridemia. Insulin drip at 5 units an hour started. And oral pain control with oxycodone. Continue clear liquid diet and IV fluid hydration. 2. Hypertriglyceridemia. Patient's blood was lipemic when she came in. Now able to read. Triglyceride was greater than 4000 when she came in. Now it's above 1000. Start gemfibrozil. Low carbohydrate diet discussed. 3. Type 2 diabetes mellitus. Hemoglobin A1c elevated at 10.8. Endocrine consultation appreciated. They started glipizide and Glucophage. Continue insulin drip until sugars less than 180 then can go with oral medications. And sliding scale. 4. Hypokalemia- since on insulin drip will replace potassium in IV fluids  Code Status:     Code Status Orders        Start     Ordered   03/03/16 1349  Full code   Continuous     03/03/16 1351    Code Status History    Date Active Date Inactive Code Status Order ID Comments User Context   10/02/2015  2:21 AM 10/03/2015  1:22 PM Full Code [id number]  Arnaldo Natal, MD Inpatient     Family Communication: Spoke with boyfriend at the bedside Disposition Plan: Home once abdominal pain better  Consultants:  Endocrine  Time spent: 25 minutes  Alford Highland  [facility]

## 2016-03-04 NOTE — Progress Notes (Addendum)
Inpatient Diabetes Program Recommendations  AACE/ADA: New Consensus Statement on Inpatient Glycemic Control (2015)  Target Ranges:  Prepandial:   less than 140 mg/dL      Peak postprandial:   less than 180 mg/dL (1-2 hours)      Critically ill patients:  140 - 180 mg/dL   Review of Glycemic Control  Results for Ashley ChanceMAJOR, [name] (MRN [id number]) as of 03/04/2016 08:46  Ref. Range 03/03/2016 09:36 03/03/2016 18:33 03/03/2016 21:28 03/04/2016 07:16  Glucose-Capillary Latest Ref Range: 65-99 mg/dL 562306 (H) 130306 (H) 865318 (H) 234 (H)    Diabetes history: yes- A1C was 7.4% on 10/03/15- she was told by Dr. Imogene Burnhen to follow up with PCP regarding elevated blood sugars Outpatient Diabetes medications: none Current orders for Inpatient glycemic control: Novolog 0-15 units tid  Inpatient Diabetes Program Recommendations:    Please change diet to carb modified clear diet.   Consider starting low dose basal insulin 22 units qday starting now (0.2 units/kg)   Consider adding Novolog 0-5 units qhs.  Consider checking A1C.  The A1C was checked at her last admission- MD made a note of this on the discharge note and asked her to follow up with her PCP.   Susette RacerJulie Almarie Kurdziel, RN, BA, MHA, CDE Diabetes Coordinator Inpatient Diabetes Program  [phone] (Team Pager) [phone] [facility] Office) 03/04/2016 8:54 AM

## 2016-03-04 NOTE — Progress Notes (Signed)
Patient is requesting tylenol for her headache.  Dr Renae GlossWieting ordered tylenol.  I informed dr Renae Glosswieting that for best practice we needed to transfer the patient to ICU while she is on an insulin drip.  Pt will be transferred when a bed is available

## 2016-03-04 NOTE — Consult Note (Signed)
ENDOCRINOLOGY CONSULTATION  REFERRING PHYSICIAN: Fidela Juneau, MD CONSULTING PHYSICIAN:  A. Wendall Mola, MD.  CHIEF COMPLAINT:  Diabetes mellitus  HISTORY OF PRESENT ILLNESS:  45 y.o. female with h/o type 2 diabetes, morbid obesity, HTN presented with several days of N/V/abd pain and found to have acute pancreatitis and severe hypertriglyceridemia. Also with uncontrolled diabetes and initial finger stick glucose of 306. She was hemodynamically stable, admitted for IV insulin and conservative care. She initially was NPO however today was started on clear liquids. Abd pain improved. Nausea has resolved. She has no prior h/o pancreatitis.   She has a h/o diabetes for >7 yrs, which was diet controlled in the past. A1c was 7.4% in 09/2015. Sugars during this admission have been consistently high and often >300. She is now on IV Regular insulin at a rate of 5 cc/hr. Sugars over course of today have ranged 234 - 263. She recalls taking metformin and glipizide several years ago. They were stopped after a period of time when she had lost quite a bit of weight. She reports approx 30 lb weight gain in last 2 years.    PAST MEDICAL HISTORY:  Past Medical History  Diagnosis Date  . Hypertension   . Diabetes mellitus without complication (HCC)      CURRENT MEDICATIONS:  . enoxaparin (LOVENOX) injection  40 mg Subcutaneous Q24H  . gemfibrozil  600 mg Oral BID AC  . [START ON 03/05/2016] glipiZIDE  5 mg Oral QAC breakfast  . ketorolac  30 mg Intravenous 4 times per day        . metoprolol tartrate  50 mg Oral BID  . pantoprazole (PROTONIX) IV  40 mg Intravenous Q24H     SOCIAL HISTORY:  Social History  Substance Use Topics  . Smoking status: Current Every Day Smoker    Types: Cigarettes  . Smokeless tobacco: None  . Alcohol Use: Yes     FAMILY HISTORY:   Family History  Problem Relation Age of Onset  . Dementia Mother       ALLERGIES:  Allergies  Allergen Reactions  . Iodine Rash    REVIEW OF SYSTEMS:  GENERAL:  No weight loss.  No fever.  HEENT:  No blurred vision. No sore throat.  NECK:  No neck pain or dysphagia.  CARDIAC:  No chest pain or palpitation.  PULMONARY:  No cough or shortness of breath.  ABDOMEN:  +abdominal pain.  No constipation. EXTREMITIES:  No lower extremity swelling.  ENDOCRINE:  No heat or cold intolerance.  GENITOURINARY:  No dysuria or hematuria. SKIN:  No recent rash or skin changes.   PHYSICAL EXAMINATION:  BP 126/68 mmHg  Pulse 95  Temp(Src) 98.5 F (36.9 C) (Oral)  Resp 18  Ht  (1.727 m)  Wt 108.863 kg (240 lb)  BMI 36.50 kg/m2  SpO2 97%  LMP 02/24/2016  GENERAL:  Morbidly obese white female in NAD. HEENT:  EOMI.  Oropharynx is clear.  NECK:  Supple.  No thyromegaly.  No neck tenderness.  CARDIAC:  Regular rate and rhythm without murmur.  PULMONARY:  Clear to auscultation bilaterally.  ABDOMEN:  Diffusely soft, nontender, nondistended. NABS. EXTREMITIES:  No peripheral edema is present.    SKIN:  No rash or dermatopathy. NEUROLOGIC:  No dysarthria.  No tremor. PSYCHIATRIC:  Alert and oriented, calm, cooperative.   LABORATORY DATA:  Results for orders placed or performed during the hospital encounter of 03/03/16 (from the past 24 hour(s))  Lipase, blood  Status: None   Collection Time: 03/03/16  5:19 PM  Result Value Ref Range   Lipase 38 11 - 51 U/L  Glucose, capillary     Status: Abnormal   Collection Time: 03/03/16  6:33 PM  Result Value Ref Range   Glucose-Capillary 306 (H) 65 - 99 mg/dL  Glucose, capillary     Status: Abnormal   Collection Time: 03/03/16  9:28 PM  Result Value Ref Range   Glucose-Capillary 318 (H) 65 - 99 mg/dL   Comment 1 Notify RN   Comprehensive metabolic panel     Status: Abnormal   Collection Time: 03/04/16  4:11 AM  Result Value Ref Range   Sodium 130 (L) 135 - 145 mmol/L   Potassium 3.5 3.5 - 5.1 mmol/L    Chloride 104 101 - 111 mmol/L   CO2 19 (L) 22 - 32 mmol/L   Glucose, Bld 244 (H) 65 - 99 mg/dL   BUN <5 (L) 6 - 20 mg/dL   Creatinine, Ser 7.840.54 0.44 - 1.00 mg/dL   Calcium 7.8 (L) 8.9 - 10.3 mg/dL   Total Protein 6.3 (L) 6.5 - 8.1 g/dL   Albumin 3.3 (L) 3.5 - 5.0 g/dL   AST 17 15 - 41 U/L   ALT 21 14 - 54 U/L   Alkaline Phosphatase 70 38 - 126 U/L   Total Bilirubin 0.8 0.3 - 1.2 mg/dL   GFR calc non Af Amer >60 >60 mL/min   GFR calc Af Amer >60 >60 mL/min   Anion gap 7 5 - 15  CBC     Status: Abnormal   Collection Time: 03/04/16  4:11 AM  Result Value Ref Range   WBC 9.2 3.6 - 11.0 K/uL   RBC 3.75 (L) 3.80 - 5.20 MIL/uL   Hemoglobin 10.8 (L) 12.0 - 16.0 g/dL   HCT 69.632.0 (L) 29.535.0 - 28.447.0 %   MCV 85.2 80.0 - 100.0 fL   MCH 28.7 26.0 - 34.0 pg   MCHC 33.7 32.0 - 36.0 g/dL   RDW 13.213.8 44.011.5 - 10.214.5 %   Platelets 190 150 - 440 K/uL  Triglycerides     Status: Abnormal   Collection Time: 03/04/16  4:11 AM  Result Value Ref Range   Triglycerides 1269 (H) <150 mg/dL  Glucose, capillary     Status: Abnormal   Collection Time: 03/04/16  7:16 AM  Result Value Ref Range   Glucose-Capillary 234 (H) 65 - 99 mg/dL   Comment 1 Notify RN   Glucose, capillary     Status: Abnormal   Collection Time: 03/04/16 10:56 AM  Result Value Ref Range   Glucose-Capillary 263 (H) 65 - 99 mg/dL   Comment 1 Notify RN   Glucose, capillary     Status: Abnormal   Collection Time: 03/04/16 12:50 PM  Result Value Ref Range   Glucose-Capillary 241 (H) 65 - 99 mg/dL     ASSESSMENT:  1.  Acute pancreatitis due to hypertriglyceridemia 2.    Diabetes mellitus, type 2 with hyperglycemia, uncontrolled 3.    Morbid Obesity 4.    Tobacco use  PLAN: 1. Continue IV Regular insulin drip for now.  2. Add metformin 500 mg bid. 3. Add glipizide 5 mg daily.  4. Stop IV insulin once blood sugars <180. 5. Needs diabetic diet. Modified liquid diet to be all sugar free. Once diet is advanced, diet should continue to  be sugar free and carb modified.  6. Counseled patient about diabetes and hypertriglyceridemia. Counseled  her about necessary dietary modifications - low carb, low fat - and about need for weight loss. 7. Encouraged smoking cessation. 8. Encouraged her to check sugars with a glucometer after hospital discharge. Encouraged her to get a store brand meter. Encouraged her to check sugars daily. Reviewed target blood sugars.  9. As she is uninsured, she plans to establish at the [facility]. She is welcome to follow up with me, as needed.   I am unavailable to follow up over this upcoming weekend but can see patient in follow up on Monday 03/07/16 if she remains hospitalized.

## 2016-03-04 NOTE — Care Management Note (Addendum)
Case Management Note  Patient Details  Name: Ashley Snow MRN: [id number] Date of Birth: [birth year]  Subjective/Objective:                  Met with patient to discuss discharge planning. She is self pay. She lives with her boyfriend and currently receiving assistant through churches and family for money. They have not income currently per patient. She has applied and turned in all documents for [facility] but never used her Medication Management application. She states she has never been offered [facility] assistance. She denies being able to pay for Rx. She states she drives and gas money comes from family. No PCP. No problems ambulating. She ate 100% of clear liquid diet. Action/Plan: Application to Medication Management delivered to patient. RNCM to follow for MATCH at discharge. Glucometer delivered from [facility] department.   Expected Discharge Date:                  Expected Discharge Plan:     In-House Referral:     Discharge planning Services  CM Consult, [facility], [facility] Program, Medication Assistance  Post Acute Care Choice:    Choice offered to:  Patient  DME Arranged:    DME Agency:     HH Arranged:    [place] Agency:     Status of Service:  In process, will continue to follow  Medicare Important Message Given:    Date Medicare IM Given:    Medicare IM give by:    Date Additional Medicare IM Given:    Additional Medicare Important Message give by:     If discussed at [place] of Stay Meetings, dates discussed:    Additional Comments:  Marshell Garfinkel, RN 03/04/2016, 8:23 AM

## 2016-03-05 LAB — BASIC METABOLIC PANEL
Anion gap: 7 (ref 5–15)
CALCIUM: 8 mg/dL — AB (ref 8.9–10.3)
CHLORIDE: 110 mmol/L (ref 101–111)
CO2: 20 mmol/L — ABNORMAL LOW (ref 22–32)
CREATININE: 0.47 mg/dL (ref 0.44–1.00)
GFR calc Af Amer: >60 mL/min (ref >60)
GFR calc non Af Amer: >60 mL/min (ref >60)
Glucose, Bld: 214 mg/dL — ABNORMAL HIGH (ref 65–99)
Potassium: 3.5 mmol/L (ref 3.5–5.1)
SODIUM: 137 mmol/L (ref 135–145)

## 2016-03-05 LAB — TRIGLYCERIDES: TRIGLYCERIDES: 769 mg/dL — AB (ref <150)

## 2016-03-05 LAB — GLUCOSE, CAPILLARY
Glucose-Capillary: 190 mg/dL — ABNORMAL HIGH (ref 65–99)
Glucose-Capillary: 172 mg/dL — ABNORMAL HIGH (ref 65–99)

## 2016-03-05 MED ORDER — PANTOPRAZOLE SODIUM 40 MG PO TBEC
40.0000 mg | DELAYED_RELEASE_TABLET | Freq: Every day | ORAL | Status: DC
  Administered 2016-03-05: 40 mg via ORAL
  Filled 2016-03-05: qty 1

## 2016-03-05 MED ORDER — METFORMIN HCL 500 MG PO TABS: 500.0000 mg | ORAL_TABLET | Freq: Two times a day (BID) | ORAL | Status: DC

## 2016-03-05 MED ORDER — GLIPIZIDE 5 MG PO TABS: 5.0000 mg | ORAL_TABLET | Freq: Every day | ORAL | Status: DC

## 2016-03-05 MED ORDER — GEMFIBROZIL 600 MG PO TABS: 600.0000 mg | ORAL_TABLET | Freq: Two times a day (BID) | ORAL | Status: DC

## 2016-03-05 NOTE — Care Management Note (Signed)
Case Management Note  Patient Details  Name: Ashley Snow MRN: [id number] Date of Birth: 1971-06-06  Subjective/Objective:    [facility].                 Action/Plan:   Expected Discharge Date:                  Expected Discharge Plan:     In-House Referral:     Discharge planning Services  CM Consult, [facility], [facility], Medication Assistance  Post Acute Care Choice:    Choice offered to:  Patient  DME Arranged:    DME Agency:     HH Arranged:    HH Agency:     Status of Service:  In process, will continue to follow  Medicare Important Message Given:    Date Medicare IM Given:    Medicare IM give by:    Date Additional Medicare IM Given:    Additional Medicare Important Message give by:     If discussed at Long Length of Stay Meetings, dates discussed:    Additional Comments:  Melodee Lupe A, RN 03/05/2016, 2:35 PM

## 2016-03-05 NOTE — Progress Notes (Signed)
Key Points: Use following P&T approved IV to PO   PHARMACIST - PHYSICIAN COMMUNICATION  CONCERNING: IV to Oral Route Change Policy  RECOMMENDATION: This patient is receiving pantoprazole by the intravenous route.  Based on criteria approved by the Pharmacy and Therapeutics Committee, the intravenous medication(s) is/are being converted to the equivalent oral dose form(s).   DESCRIPTION: These criteria include:  The patient is eating (either orally or via tube) and/or has been taking other orally administered medications for a least 24 hours  The patient has no evidence of active gastrointestinal bleeding or impaired GI absorption (gastrectomy, short bowel, patient on TNA or NPO).  If you have questions about this conversion, please contact the Pharmacy Department [phone]( [phone] )  [facility]    Cher NakaiSheema Greysen Devino, PharmD Pharmacy Resident  03/05/2016 8:10 AM

## 2016-03-05 NOTE — Progress Notes (Signed)
Pt being discharged home at this time, discharge instructions and prescriptions reviewed with pt and sig. Other, medication match program discussed with pt as well, states understanding, pt with no noted complaints at discharge, no distress or discomfort noted, tolerated diet as ordered

## 2016-03-05 NOTE — Discharge Instructions (Signed)
°  DIET:  Low fat, Low cholesterol diet, carbohydrate controlled diet  DISCHARGE CONDITION:  Good  ACTIVITY:  Activity as tolerated  OXYGEN:  Home Oxygen: No.   Oxygen Delivery: room air  DISCHARGE LOCATION:  home    ADDITIONAL DISCHARGE INSTRUCTION:   If you experience worsening of your admission symptoms, develop shortness of breath, life threatening emergency, suicidal or homicidal thoughts you must seek medical attention immediately by calling 911 or calling your MD immediately  if symptoms less severe.  You Must read complete instructions/literature along with all the possible adverse reactions/side effects for all the Medicines you take and that have been prescribed to you. Take any new Medicines after you have completely understood and accpet all the possible adverse reactions/side effects.   Please note  You were cared for by a hospitalist during your hospital stay. If you have any questions about your discharge medications or the care you received while you were in the hospital after you are discharged, you can call the unit and asked to speak with the hospitalist on call if the hospitalist that took care of you is not available. Once you are discharged, your primary care physician will handle any further medical issues. Please note that NO REFILLS for any discharge medications will be authorized once you are discharged, as it is imperative that you return to your primary care physician (or establish a relationship with a primary care physician if you do not have one) for your aftercare needs so that they can reassess your need for medications and monitor your lab values.

## 2016-03-06 NOTE — Discharge Summary (Signed)
Ashley Snow, 45 y.o., DOB 28-Nov-1971, MRN [id number]. Admission date: 03/03/2016 Discharge Date 03/06/2016 Primary MD No PCP Per Patient Admitting Physician Katha HammingSnehalatha Konidena, MD  Admission Diagnosis  Hyperlipidemia [E78.5] LLQ pain [R10.32] Acute pancreatitis, unspecified pancreatitis type [K85.9]  Discharge Diagnosis   Active Problems:   Acute pancreatitis due to hypertriglyceridemia  Hypertriglyceridemia Diabetes type 2  Hypokalemia        Hospital Course Ashley Snow is a 45 y.o. female with a known history of hypertension, hypertriglyceridemia comes in because of abdominal pain. Patient started having abdominal pain day prior evening, says that about pain is generalized associated with nausea. Abdominal pain is progressively worst on day of admission patient came to the ED and was noted to have elevated triglyceride levels in the 3000 range as well as elevated lipase. She was admitted and started on insulin drip. Kept nothing by mouth IV fluids were given. Her symptoms started to improve her abdominal pain resolved resolved. She was seen by endocrinology as well.            Consults  endocronlogly  Significant Tests:  See full reports for all details      Ct Abdomen Pelvis Wo Contrast  03/03/2016  CLINICAL DATA:  Left lower quadrant pain. EXAM: CT ABDOMEN AND PELVIS WITHOUT CONTRAST TECHNIQUE: Multidetector CT imaging of the abdomen and pelvis was performed following the standard protocol without IV contrast. COMPARISON:  None. FINDINGS: Lower chest:  Clear lung bases.  Normal heart size. Hepatobiliary: Diffuse low attenuation of the liver as can be seen with hepatic steatosis. No hepatic mass. Cholelithiasis. Pancreas: Mild hazy inflammatory change around the ventral aspect of the pancreatic head adjacent to the duodenum. Spleen: Normal. Adrenals/Urinary Tract: Normal adrenal glands. Normal kidneys. No urolithiasis or obstructive uropathy. Normal bladder. Stomach/Bowel:  No bowel wall thickening or bowel dilatation. No pneumatosis, pneumoperitoneum or portal venous gas. No abdominal or pelvic free fluid. Normal appendix. Vascular/Lymphatic: Normal caliber abdominal aorta. No lymphadenopathy. Reproductive: Normal uterus.  No adnexal mass. Other: No fluid collection or hematoma. Musculoskeletal: No lytic or sclerotic osseous lesion. No acute osseous abnormality. IMPRESSION: 1. Mild hazy inflammatory change around the ventral aspect of the pancreatic head adjacent to the duodenum. This may reflect mild pancreatitis versus mild duodenitis. Electronically Signed   By: Elige KoHetal  Ashley Snow   On: 03/03/2016 11:09       Today   Subjective:   Ashley Snow  feels well abdominal pain resolved Objective:   Blood pressure 130/79, pulse 95, temperature 98.1 F (36.7 C), temperature source Oral, resp. rate 17, height 5\' 8"  (1.727 m), weight 113.4 kg (250 lb), last menstrual period 02/24/2016, SpO2 100 %.  . No intake or output data in the 24 hours ending 03/06/16 1330  Exam VITAL SIGNS: Blood pressure 130/79, pulse 95, temperature 98.1 F (36.7 C), temperature source Oral, resp. rate 17, height 5\' 8"  (1.727 m), weight 113.4 kg (250 lb), last menstrual period 02/24/2016, SpO2 100 %.  GENERAL:  45 y.o.-year-old patient lying in the bed with no acute distress.  EYES: Pupils equal, round, reactive to light and accommodation. No scleral icterus. Extraocular muscles intact.  HEENT: Head atraumatic, normocephalic. Oropharynx and nasopharynx clear.  NECK:  Supple, no jugular venous distention. No thyroid enlargement, no tenderness.  LUNGS: Normal breath sounds bilaterally, no wheezing, rales,rhonchi or crepitation. No use of accessory muscles of respiration.  CARDIOVASCULAR: S1, S2 normal. No murmurs, rubs, or gallops.  ABDOMEN: Soft, nontender, nondistended. Bowel sounds present. No organomegaly or mass.  EXTREMITIES:  No pedal edema, cyanosis, or clubbing.  NEUROLOGIC: Cranial nerves  II through XII are intact. Muscle strength 5/5 in all extremities. Sensation intact. Gait not checked.  PSYCHIATRIC: The patient is alert and oriented x 3.  SKIN: No obvious rash, lesion, or ulcer.   Data Review     CBC w Diff:  Lab Results  Component Value Date   WBC 9.2 03/04/2016   WBC 9.2 04/24/2015   HGB 10.8* 03/04/2016   HGB 8.3* 04/24/2015   HCT 32.0* 03/04/2016   HCT 26.5* 04/24/2015   PLT 190 03/04/2016   PLT 296 04/24/2015   LYMPHOPCT 12 10/01/2015   LYMPHOPCT 21.6 04/24/2015   MONOPCT 4 10/01/2015   MONOPCT 4.3 04/24/2015   EOSPCT 0 10/01/2015   EOSPCT 2.0 04/24/2015   BASOPCT 1 10/01/2015   BASOPCT 0.6 04/24/2015   CMP:  Lab Results  Component Value Date   NA 137 03/05/2016   NA 137 04/24/2015   K 3.5 03/05/2016   K 3.4* 04/24/2015   CL 110 03/05/2016   CL 106 04/24/2015   CO2 20* 03/05/2016   CO2 24 04/24/2015   BUN <5* 03/05/2016   BUN 11 04/24/2015   CREATININE 0.47 03/05/2016   CREATININE 0.74 04/24/2015   PROT 6.3* 03/04/2016   ALBUMIN 3.3* 03/04/2016   BILITOT 0.8 03/04/2016   ALKPHOS 70 03/04/2016   AST 17 03/04/2016   ALT 21 03/04/2016  .  Micro Results Recent Results (from the past 240 hour(s))  MRSA PCR Screening     Status: None   Collection Time: 03/04/16  3:45 PM  Result Value Ref Range Status   MRSA by PCR NEGATIVE NEGATIVE Final    Comment:        The GeneXpert MRSA Assay (FDA approved for NASAL specimens only), is one component of a comprehensive MRSA colonization surveillance program. It is not intended to diagnose MRSA infection nor to guide or monitor treatment for MRSA infections.      Code Status History    Date Active Date Inactive Code Status Order ID Comments User Context   03/03/2016  1:51 PM 03/05/2016  6:54 PM Full Code [id number]  Katha Hamming, MD ED   10/02/2015  2:21 AM 10/03/2015  1:22 PM Full Code [id number]  Arnaldo Natal, MD Inpatient          Follow-up Information    Follow up with  [facility] In 7 days.   Specialty:  Primary Care   Contact information:   [address] [place] [address] [place] [address] [phone]      Discharge Medications     Medication List    TAKE these medications        ferrous sulfate 325 (65 FE) MG tablet  Take 325 mg by mouth daily.     gemfibrozil 600 MG tablet  Commonly known as:  LOPID  Take 1 tablet (600 mg total) by mouth 2 (two) times daily before a meal.     glipiZIDE 5 MG tablet  Commonly known as:  GLUCOTROL  Take 1 tablet (5 mg total) by mouth daily before breakfast.     hydrochlorothiazide 25 MG tablet  Commonly known as:  HYDRODIURIL  Take 1 tablet (25 mg total) by mouth daily.     ibuprofen 200 MG tablet  Commonly known as:  ADVIL,MOTRIN  Take 400-600 mg by mouth every 6 (six) hours as needed for mild pain or moderate pain.  lisinopril 20 MG tablet  Commonly known as:  PRINIVIL,ZESTRIL  Take 1 tablet (20 mg total) by mouth daily.     metFORMIN 500 MG tablet  Commonly known as:  GLUCOPHAGE  Take 1 tablet (500 mg total) by mouth 2 (two) times daily with a meal.           Total Time in preparing paper work, data evaluation and todays exam - 35 minutes  Auburn Bilberry M.D on 03/06/2016 at 1:30 PM  [facility]   Office  [phone]

## 2016-03-07 LAB — GLUCOSE, CAPILLARY: Glucose-Capillary: 196 mg/dL — ABNORMAL HIGH (ref 65–99)

## 2016-03-14 ENCOUNTER — Ambulatory Visit: Payer: Self-pay

## 2016-03-21 ENCOUNTER — Ambulatory Visit: Payer: Self-pay

## 2016-04-14 ENCOUNTER — Ambulatory Visit: Payer: Self-pay

## 2016-04-14 ENCOUNTER — Ambulatory Visit: Payer: Self-pay | Admitting: Ophthalmology

## 2016-04-14 ENCOUNTER — Ambulatory Visit: Payer: Self-pay | Admitting: Urology

## 2016-04-14 DIAGNOSIS — E119 Type 2 diabetes mellitus without complications: Secondary | ICD-10-CM

## 2016-04-14 DIAGNOSIS — I10 Essential (primary) hypertension: Secondary | ICD-10-CM

## 2016-04-14 DIAGNOSIS — E1165 Type 2 diabetes mellitus with hyperglycemia: Secondary | ICD-10-CM | POA: Insufficient documentation

## 2016-04-14 MED ORDER — HYDROCHLOROTHIAZIDE 25 MG PO TABS: 25.0000 mg | ORAL_TABLET | Freq: Every day | ORAL | Status: DC

## 2016-04-14 MED ORDER — FENOFIBRATE 145 MG PO TABS: 145.0000 mg | ORAL_TABLET | Freq: Every day | ORAL | Status: DC

## 2016-04-14 MED ORDER — METFORMIN HCL 500 MG PO TABS: 500.0000 mg | ORAL_TABLET | Freq: Two times a day (BID) | ORAL | Status: DC

## 2016-04-14 MED ORDER — FERROUS SULFATE 325 (65 FE) MG PO TABS: 325.0000 mg | ORAL_TABLET | Freq: Every day | ORAL | Status: DC

## 2016-04-14 MED ORDER — LOSARTAN POTASSIUM 50 MG PO TABS: 50.0000 mg | ORAL_TABLET | Freq: Every day | ORAL | Status: DC

## 2016-04-14 MED ORDER — GLIPIZIDE 5 MG PO TABS: 5.0000 mg | ORAL_TABLET | Freq: Every day | ORAL | Status: DC

## 2016-04-14 NOTE — Progress Notes (Signed)
       Patient: Ashley Snow Female    DOB: 1971/05/19   45 y.o.   MRN: [id number] Visit Date: 04/14/2016  Today's Provider: [facility]   Chief Complaint  Patient presents with  . Follow-up    Diabetes, HTN, HLD   Subjective:    Diabetes She presents for her follow-up diabetic visit. She has type 2 diabetes mellitus. No MedicAlert identification noted. The initial diagnosis of diabetes was made 6 years ago. Her disease course has been improving. There are no hypoglycemic associated symptoms. Associated symptoms include blurred vision and visual change. Pertinent negatives for diabetes include no chest pain, no fatigue, no foot paresthesias, no foot ulcerations, no polydipsia, no polyphagia, no polyuria, no weakness and no weight loss. There are no hypoglycemic complications. Symptoms are improving. There are no diabetic complications. Risk factors for coronary artery disease include stress, hypertension, obesity, dyslipidemia, diabetes mellitus and tobacco exposure. Current diabetic treatment includes oral agent (dual therapy). She is compliant with treatment most of the time. Her weight is fluctuating dramatically. She is following a generally unhealthy diet. When asked about meal planning, she reported none. She has not had a previous visit with a dietitian. Blood glucose monitoring compliance is inadequate. An ACE inhibitor/angiotensin II receptor blocker is being taken. She does not see a podiatrist.Eye exam is current (Eye exam 04/14/2016).       Allergies  Allergen Reactions  . Iodine Rash   Previous Medications   GEMFIBROZIL (LOPID) 600 MG TABLET    Take 1 tablet (600 mg total) by mouth 2 (two) times daily before a meal.   IBUPROFEN (ADVIL,MOTRIN) 200 MG TABLET    Take 400-600 mg by mouth every 6 (six) hours as needed for mild pain or moderate pain. Reported on 04/14/2016   MULTIPLE VITAMINS-MINERALS (MULTIVITAMIN WITH MINERALS) TABLET    Take 1 tablet by mouth daily.      Review of Systems  Constitutional: Negative for weight loss and fatigue.  Eyes: Positive for blurred vision.  Cardiovascular: Negative for chest pain.  Endocrine: Negative for polydipsia, polyphagia and polyuria.  Neurological: Negative for weakness.    Social History  Substance Use Topics  . Smoking status: Current Every Day Smoker    Types: Cigarettes  . Smokeless tobacco: Not on file  . Alcohol Use: Yes   Objective:   There were no vitals taken for this visit.  Physical Exam  Constitutional: Well nourished. Alert and oriented, No acute distress. HEENT: [place] AT, moist mucus membranes. Trachea midline, no masses. Cardiovascular: No clubbing, cyanosis, or edema. Respiratory: Normal respiratory effort, no increased work of breathing. GI: Abdomen is soft, non tender, non distended, no abdominal masses.  Skin: No rashes, bruises or suspicious lesions. Lymph: No cervical or inguinal adenopathy. Neurologic: Grossly intact, no focal deficits, moving all 4 extremities. Psychiatric: Normal mood and affect.     Assessment & Plan:     1. DM:   Does not check BS at home, she will try to be more compliant    Refills given for metformin and glipizide    Check HbgA1C  2. HTN:  Having a cough with lisinopril, change to losartan     Recheck BP in 2 weeks  3. HLD:   Refills given for Tricor      Check lipids today      [facility]  [facility]

## 2016-04-15 LAB — LIPID PANEL
CHOL/HDL RATIO: 5.5 ratio — AB (ref 0.0–4.4)
CHOLESTEROL TOTAL: 171 mg/dL (ref 100–199)
HDL: 31 mg/dL — AB (ref >39)
LDL Calculated: 87 mg/dL (ref 0–99)
TRIGLYCERIDES: 264 mg/dL — AB (ref 0–149)
VLDL Cholesterol Cal: 53 mg/dL — ABNORMAL HIGH (ref 5–40)

## 2016-04-15 LAB — TSH: TSH: 2.16 u[IU]/mL (ref 0.450–4.500)

## 2016-04-15 LAB — HEMOGLOBIN A1C
ESTIMATED AVERAGE GLUCOSE: 229 mg/dL
Hgb A1c MFr Bld: 9.6 % — ABNORMAL HIGH (ref 4.8–5.6)

## 2016-04-19 ENCOUNTER — Telehealth: Payer: Self-pay | Admitting: Nurse Practitioner

## 2016-04-19 ENCOUNTER — Other Ambulatory Visit: Payer: Self-pay | Admitting: Nurse Practitioner

## 2016-04-19 NOTE — Telephone Encounter (Signed)
Pt was here on 4/20 and had all meds [facility]. However, only 3 were there. Still needs glycoside (glipizide?), metformin and water pill (?) filled.

## 2016-04-28 ENCOUNTER — Ambulatory Visit: Payer: Self-pay

## 2016-05-03 ENCOUNTER — Telehealth: Payer: Self-pay

## 2016-05-03 NOTE — Telephone Encounter (Signed)
I have called the patient 3 different times on 3 different days to reschedule her appointment each time I call the phone rings twice and then hangs up. I am unable to leave a message.

## 2016-05-30 ENCOUNTER — Other Ambulatory Visit: Payer: Self-pay | Admitting: Urology

## 2016-06-29 ENCOUNTER — Encounter: Payer: Self-pay | Admitting: Internal Medicine

## 2016-06-29 ENCOUNTER — Ambulatory Visit: Payer: Self-pay | Admitting: Internal Medicine

## 2016-06-29 VITALS — BP 133/86 | HR 103 | Temp 99.1°F | Wt 245.0 lb

## 2016-06-29 DIAGNOSIS — E119 Type 2 diabetes mellitus without complications: Secondary | ICD-10-CM

## 2016-06-29 LAB — GLUCOSE, POCT (MANUAL RESULT ENTRY): POC GLUCOSE: 280 mg/dL — AB (ref 70–99)

## 2016-06-29 MED ORDER — MICONAZOLE NITRATE 2 % EX CREA: 1.0000 "application " | TOPICAL_CREAM | Freq: Two times a day (BID) | CUTANEOUS | Status: DC

## 2016-06-29 MED ORDER — SULFAMETHOXAZOLE-TRIMETHOPRIM 800-160 MG PO TABS: 1.0000 | ORAL_TABLET | Freq: Two times a day (BID) | ORAL | Status: DC

## 2016-06-29 NOTE — Patient Instructions (Signed)
Follow up in 1-2 weeks for follow up on diabetes- same doctor as previous appt.

## 2016-06-29 NOTE — Progress Notes (Signed)
   Subjective:    Patient ID: Ashley Snow, female    DOB: [birth year], 45 y.o.   MRN: [id number]  HPI  Patient presents today with follow up on diabetes, hypertension, and hyperlipidemia. Blood glucose currently elevated at 280.  Patient also experiencing increased urination frequency every 2-3 hours and burning for past 3-4 days due to possible yeast infection  Patient Active Problem List   Diagnosis Date Noted  . Hypertension 04/14/2016  . Diabetes (HCC) 04/14/2016  . Acute pancreatitis 03/03/2016  . Pancreatitis 03/03/2016  . Headache 10/02/2015      Medication List       This list is accurate as of: 06/29/16 11:52 AM.  Always use your most recent med list.               fenofibrate 145 MG tablet  Commonly known as:  TRICOR  Take 1 tablet (145 mg total) by mouth daily.     ferrous sulfate 325 (65 FE) MG tablet  Take 1 tablet (325 mg total) by mouth daily.     gemfibrozil 600 MG tablet  Commonly known as:  LOPID  Take 1 tablet (600 mg total) by mouth 2 (two) times daily before a meal.     glipiZIDE 5 MG tablet  Commonly known as:  GLUCOTROL  TAKE ONE TABLET BY MOUTH EVERY DAY BEFORE BREAKFAST.     hydrochlorothiazide 25 MG tablet  Commonly known as:  HYDRODIURIL  Take 1 tablet (25 mg total) by mouth daily.     ibuprofen 200 MG tablet  Commonly known as:  ADVIL,MOTRIN  Take 400-600 mg by mouth every 6 (six) hours as needed for mild pain or moderate pain. Reported on 04/14/2016     losartan 50 MG tablet  Commonly known as:  COZAAR  Take 1 tablet (50 mg total) by mouth daily.     metFORMIN 500 MG tablet  Commonly known as:  GLUCOPHAGE  TAKE ONE TABLET BY MOUTH 2 TIMES A DAY WITH A MEAL     multivitamin tablet  Take 1 tablet by mouth daily.     multivitamin with minerals tablet  Take 1 tablet by mouth daily.         Review of Systems     Objective:   Physical Exam  Constitutional: She is oriented to person, place, and time.  Cardiovascular: Normal  rate and regular rhythm.   Pulmonary/Chest: Effort normal and breath sounds normal.  Neurological: She is alert and oriented to person, place, and time.    BP 133/86 mmHg  Pulse 103  Temp(Src) 99.1 F (37.3 C)  Wt 245 lb (111.131 kg)       Assessment & Plan:  Patient started on septra 1 tablet 2x daily for UTI Patient also started on micronazole for yeast infection  Follow up in 1-2 weeks for review of diabetic labs and treatment

## 2016-06-29 NOTE — Addendum Note (Signed)
Addended by: Debby BudHALE, Atlee Kluth S on: 06/29/2016 12:27 PM   Modules accepted: Orders

## 2016-06-30 LAB — URINALYSIS
BILIRUBIN UA: NEGATIVE
Ketones, UA: NEGATIVE
NITRITE UA: NEGATIVE
PH UA: 7.5 (ref 5.0–7.5)
RBC, UA: NEGATIVE
Specific Gravity, UA: >=1.03 — AB (ref 1.005–1.030)
UUROB: 0.2 mg/dL (ref 0.2–1.0)

## 2016-07-02 LAB — URINE CULTURE

## 2016-07-07 ENCOUNTER — Ambulatory Visit: Payer: Self-pay

## 2016-08-10 ENCOUNTER — Encounter: Payer: Self-pay | Admitting: Internal Medicine

## 2016-08-10 ENCOUNTER — Ambulatory Visit: Payer: Self-pay | Admitting: Internal Medicine

## 2016-08-10 VITALS — BP 142/95 | HR 87 | Temp 99.0°F | Wt 242.0 lb

## 2016-08-10 DIAGNOSIS — E785 Hyperlipidemia, unspecified: Secondary | ICD-10-CM

## 2016-08-10 DIAGNOSIS — E119 Type 2 diabetes mellitus without complications: Secondary | ICD-10-CM

## 2016-08-10 LAB — GLUCOSE, POCT (MANUAL RESULT ENTRY): POC GLUCOSE: 329 mg/dL — AB (ref 70–99)

## 2016-08-10 MED ORDER — MICONAZOLE NITRATE 2 % EX CREA: 1.0000 "application " | TOPICAL_CREAM | Freq: Two times a day (BID) | CUTANEOUS | 0 refills | Status: DC

## 2016-08-10 MED ORDER — METFORMIN HCL 500 MG PO TABS: 500.0000 mg | ORAL_TABLET | Freq: Two times a day (BID) | ORAL | 12 refills | Status: DC

## 2016-08-10 NOTE — Patient Instructions (Signed)
F/u in 3 months with fasting labs: A1C, CBC, Met C, UA, Lipid

## 2016-08-10 NOTE — Progress Notes (Signed)
   Subjective:    Patient ID: Ashley Snow, female    DOB: 08-04-1971, 45 y.o.   MRN: [id number]  HPI  Pt. F/u for diabetes and hyperlipidemia. Glucose elevated to 329. Pt reports metformin irritates stomach and digestion. Reports inconsistent taking of medication   Patient Active Problem List   Diagnosis Date Noted  . Hyperlipidemia 08/10/2016  . Hypertension 04/14/2016  . Diabetes ([place]) 04/14/2016  . Acute pancreatitis 03/03/2016  . Pancreatitis 03/03/2016  . Headache 10/02/2015     Medication List       Accurate as of 08/10/16 11:19 AM. Always use your most recent med list.          fenofibrate 145 MG tablet Commonly known as:  TRICOR Take 1 tablet (145 mg total) by mouth daily.   ferrous sulfate 325 (65 FE) MG tablet Take 1 tablet (325 mg total) by mouth daily.   gemfibrozil 600 MG tablet Commonly known as:  LOPID Take 1 tablet (600 mg total) by mouth 2 (two) times daily before a meal.   glipiZIDE 5 MG tablet Commonly known as:  GLUCOTROL TAKE ONE TABLET BY MOUTH EVERY DAY BEFORE BREAKFAST.   hydrochlorothiazide 25 MG tablet Commonly known as:  HYDRODIURIL Take 1 tablet (25 mg total) by mouth daily.   ibuprofen 200 MG tablet Commonly known as:  ADVIL,MOTRIN Take 400-600 mg by mouth every 6 (six) hours as needed for mild pain or moderate pain. Reported on 04/14/2016   losartan 50 MG tablet Commonly known as:  COZAAR Take 1 tablet (50 mg total) by mouth daily.   metFORMIN 500 MG tablet Commonly known as:  GLUCOPHAGE TAKE ONE TABLET BY MOUTH 2 TIMES A DAY WITH A MEAL   miconazole 2 % cream Commonly known as:  MICOTIN Apply 1 application topically 2 (two) times daily. Apply to vaginal area twice daily for fungal symptoms until clear   multivitamin tablet Take 1 tablet by mouth daily.   multivitamin with minerals tablet Take 1 tablet by mouth daily.   sulfamethoxazole-trimethoprim 800-160 MG tablet Commonly known as:  BACTRIM DS,SEPTRA DS Take 1  tablet by mouth 2 (two) times daily.        Review of Systems     Objective:   Physical Exam  Constitutional: She is oriented to person, place, and time.  Cardiovascular: Normal rate, regular rhythm and normal heart sounds.   Pulmonary/Chest: Effort normal and breath sounds normal.  Neurological: She is alert and oriented to person, place, and time.    BP (!) 142/95   Pulse 87   Temp 99 F (37.2 C)   Wt 242 lb (109.8 kg)   BMI 36.80 kg/m         Assessment & Plan:  Pt. To gradually increase metformin to up to 4/day  F/u in 3 months with labs: A1C, CBC, UA, Met C, Lipid

## 2016-10-13 ENCOUNTER — Ambulatory Visit: Payer: Self-pay | Admitting: Ophthalmology

## 2016-10-27 ENCOUNTER — Other Ambulatory Visit: Payer: Self-pay

## 2016-11-08 ENCOUNTER — Ambulatory Visit: Payer: Self-pay

## 2016-11-09 ENCOUNTER — Ambulatory Visit: Payer: Self-pay | Admitting: Internal Medicine

## 2016-11-29 ENCOUNTER — Other Ambulatory Visit: Payer: Self-pay

## 2016-12-01 ENCOUNTER — Ambulatory Visit: Payer: Self-pay

## 2017-04-11 ENCOUNTER — Telehealth: Payer: Self-pay | Admitting: Nurse Practitioner

## 2017-04-11 NOTE — Telephone Encounter (Signed)
Called pt, left message stating apt will be canceled on 4/19 and they need to call us back to reschedule paper eye exam.

## 2017-04-13 ENCOUNTER — Ambulatory Visit: Payer: Self-pay | Admitting: Ophthalmology

## 2021-04-27 ENCOUNTER — Ambulatory Visit: Payer: Self-pay | Admitting: Gerontology

## 2021-04-27 ENCOUNTER — Other Ambulatory Visit: Payer: Self-pay

## 2021-04-27 ENCOUNTER — Encounter: Payer: Self-pay | Admitting: Gerontology

## 2021-04-27 VITALS — BP 90/60 | HR 110 | Temp 96.8°F | Resp 16 | Ht 68.0 in | Wt 218.9 lb

## 2021-04-27 DIAGNOSIS — Z7689 Persons encountering health services in other specified circumstances: Secondary | ICD-10-CM

## 2021-04-27 DIAGNOSIS — I959 Hypotension, unspecified: Secondary | ICD-10-CM | POA: Insufficient documentation

## 2021-04-27 DIAGNOSIS — Z889 Allergy status to unspecified drugs, medicaments and biological substances status: Secondary | ICD-10-CM

## 2021-04-27 DIAGNOSIS — E1140 Type 2 diabetes mellitus with diabetic neuropathy, unspecified: Secondary | ICD-10-CM

## 2021-04-27 MED ORDER — CETIRIZINE HCL 10 MG PO TABS
10.0000 mg | ORAL_TABLET | Freq: Every day | ORAL | 0 refills | Status: AC
  Filled 2021-04-27: qty 30, 30d supply, fill #0

## 2021-04-27 MED ORDER — BLOOD PRESSURE KIT
1.0000 | PACK | Freq: Two times a day (BID) | 0 refills | Status: AC
  Filled 2021-04-27: qty 1, fill #0

## 2021-04-27 MED ORDER — GABAPENTIN 100 MG PO CAPS
100.0000 mg | ORAL_CAPSULE | Freq: Every day | ORAL | 0 refills | Status: DC
  Filled 2021-04-27: qty 30, 30d supply, fill #0

## 2021-04-27 NOTE — Patient Instructions (Signed)
-  Increase water Intake - Low salt diet -Low Carbohydrate diet.

## 2021-04-27 NOTE — Progress Notes (Signed)
New Patient Office Visit  Subjective:  Patient ID: Ashley Snow, female    DOB: Nov 15, 1971  Age: 50 y.o. MRN: [id number]  CC:  Chief Complaint  Patient presents with  . Establish Care  . Diabetes  . Hypertension  . Hyperlipidemia    HPI Ashley Snow a a 50 y/o female who has a history of T2DM, Hyperlipidemia, Hypertension and seasonal allergies and presents to re-establish care. She has a history of hypertension, but during visit her blood pressure was low, which is associated with light headedness, vertigo especially when changing position from bending to standing. She states that she's not drinking enough water due to staying in a shelter and has no access to water.  She also reports sesonal allergy which is associated with sneezing, watery eyes and occasional rhinorrea. She states that taking otc claritin offered no relief. She has a history of hypertension and takes 5 mg Amlodipine and 20 mg Lisinopril daily. Also has T2DM and takes 1000 mg Metformin bid, 30 units of 70/30 Insulin daily. She checks her blood glucose daily and it was 191 g/dl yesterday prior to injecting her insulin. She denies hypo/hyperglycemia, but reports worsening peripheral neuropathy and was taking gabapentin in the past. She has a history of Hyperlipidemia, and takes Atorvastatin 80 mg daily, denies myalgia and jaundice. She also takes Miralax 17 gm as needed for chronic constipation, but reports daily bowel movement for the past 2 weeks. 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 Overall, she states that she's doing well and offers no further complaint.  Past Medical History:  Diagnosis Date  . Diabetes mellitus without complication ([place])   . Hyperlipidemia   . Hypertension   . Seasonal allergies     Past Surgical History:  Procedure Laterality Date  . CESAREAN SECTION    . KIDNEY STONE SURGERY    . TUBAL LIGATION      Family History  Problem Relation Age of Onset  .  Dementia Mother   . Hypotension Mother   . Diabetes Mother   . Alcoholism Father   . Hypertension Father   . Hyperlipidemia Father   . Dementia Maternal Grandmother   . Alzheimer's disease Maternal Grandmother     Social History   Socioeconomic History  . Marital status: Single    Spouse name: Not on file  . Number of children: Not on file  . Years of education: Not on file  . Highest education level: Not on file  Occupational History  . Not on file  Tobacco Use  . Smoking status: Current Every Day Smoker    Packs/day: 0.50    Types: Cigarettes  . Smokeless tobacco: Never Used  Vaping Use  . Vaping Use: Never used  Substance and Sexual Activity  . Alcohol use: Yes    Comment: social  . Drug use: Not Currently    Types: "Crack" cocaine    Comment: last use 09/2020  . Sexual activity: Yes    Birth control/protection: I.U.D.  Other Topics Concern  . Not on file  Social History Narrative  . Not on file   Social Determinants of Health   Financial Resource Strain: Not on file  Food Insecurity: No Food Insecurity  . Worried About Charity fundraiser in the Last Year: Never true  . Ran Out of Food in the Last Year: Never true  Transportation Needs: No Transportation Needs  . Lack of Transportation (Medical): No  . Lack of  Transportation (Non-Medical): No  Physical Activity: Not on file  Stress: Not on file  Social Connections: Not on file  Intimate Partner Violence: Not on file    ROS Review of Systems  Constitutional: Negative.   HENT: Negative.   Eyes: Negative.   Respiratory: Negative.   Cardiovascular: Negative.   Gastrointestinal: Negative.   Endocrine: Negative.   Genitourinary: Negative.   Musculoskeletal: Negative.   Skin: Negative.   Neurological: Negative.   Hematological: Negative.   Psychiatric/Behavioral: Negative.     Objective:   Today's Vitals: BP 90/60 (BP Location: Right Arm, Patient Position: Sitting, Cuff Size: Large)   Pulse (!)  110   Temp (!) 96.8 F (36 C)   Resp 16   Ht _0  (1.727 m)   Wt 218 lb 14.4 oz (99.3 kg)   SpO2 96%   BMI 33.28 kg/m   Physical Exam HENT:     Head: Normocephalic and atraumatic.     Nose: Nose normal.     Mouth/Throat:     Mouth: Mucous membranes are moist.  Eyes:     Extraocular Movements: Extraocular movements intact.     Conjunctiva/sclera: Conjunctivae normal.     Pupils: Pupils are equal, round, and reactive to light.  Cardiovascular:     Rate and Rhythm: Normal rate and regular rhythm.     Pulses: Normal pulses.     Heart sounds: Normal heart sounds.  Pulmonary:     Effort: Pulmonary effort is normal.     Breath sounds: Normal breath sounds.  Abdominal:     General: Bowel sounds are normal.     Palpations: Abdomen is soft.  Genitourinary:    Comments: Deferred per patient. Musculoskeletal:        General: Normal range of motion.     Cervical back: Normal range of motion.  Skin:    General: Skin is warm.  Neurological:     General: No focal deficit present.     Mental Status: She is alert and oriented to person, place, and time. Mental status is at baseline.  Psychiatric:        Mood and Affect: Mood normal.        Behavior: Behavior normal.        Thought Content: Thought content normal.        Judgment: Judgment normal.     Assessment & Plan:   1. Encounter to establish care - Routine labs will be checked - CBC w/Diff; Future - Comp Met (CMET); Future - Lipid panel; Future - Urinalysis; Future - TSH; Future  2. History of seasonal allergies - She will continue on current treatment regimen, was educated on medication side effects and advised to notify clinic. - cetirizine (ZYRTEC ALLERGY) 10 MG tablet; Take 1 tablet (10 mg total) by mouth daily.  Dispense: 30 tablet; Refill: 0  3. Type 2 diabetes mellitus with diabetic neuropathy, unspecified whether long term insulin use ([place]) - She will continue on current treatment regimen, check blood  glucose daily, record and bring log to follow up appointment. Will continue on low carb/non concentrated sweet diet and exercise as tolerated. - HgB A1c; Future - Urine Microalbumin w/creat. ratio - gabapentin (NEURONTIN) 100 MG capsule; Take 1 capsule (100 mg total) by mouth at bedtime.  Dispense: 30 capsule; Refill: 0  4. Hypotension, unspecified hypotension type - Her Amlodipine and Lisinopril was discontinued, she was provided with Blood pressure monitor, advised to check, record and bring log to follow up appointment. -  Blood Pressure KIT; Use as directed in the morning and at bedtime.  Dispense: 1 kit; Refill: 0     Follow-up: Return in about 2 weeks (around 05/11/2021), or if symptoms worsen or fail to improve.   Darreon Lutes Jerold Coombe, NP

## 2021-04-28 ENCOUNTER — Other Ambulatory Visit: Payer: Medicaid Other

## 2021-05-09 ENCOUNTER — Inpatient Hospital Stay
Admission: EM | Admit: 2021-05-09 | Discharge: 2021-05-17 | DRG: 872 | Disposition: A | Discharge status: Home / Self Care | Payer: Medicaid Other | Attending: Internal Medicine | Admitting: Internal Medicine

## 2021-05-09 ENCOUNTER — Emergency Department: Payer: Medicaid Other

## 2021-05-09 ENCOUNTER — Inpatient Hospital Stay: Payer: Medicaid Other

## 2021-05-09 ENCOUNTER — Other Ambulatory Visit: Payer: Self-pay

## 2021-05-09 DIAGNOSIS — Z716 Tobacco abuse counseling: Secondary | ICD-10-CM

## 2021-05-09 DIAGNOSIS — Z79899 Other long term (current) drug therapy: Secondary | ICD-10-CM | POA: Diagnosis not present

## 2021-05-09 DIAGNOSIS — Z9114 Patient's other noncompliance with medication regimen: Secondary | ICD-10-CM

## 2021-05-09 DIAGNOSIS — Z833 Family history of diabetes mellitus: Secondary | ICD-10-CM | POA: Diagnosis not present

## 2021-05-09 DIAGNOSIS — F17210 Nicotine dependence, cigarettes, uncomplicated: Secondary | ICD-10-CM | POA: Diagnosis present

## 2021-05-09 DIAGNOSIS — Z22322 Carrier or suspected carrier of Methicillin resistant Staphylococcus aureus: Secondary | ICD-10-CM

## 2021-05-09 DIAGNOSIS — K5900 Constipation, unspecified: Secondary | ICD-10-CM | POA: Diagnosis present

## 2021-05-09 DIAGNOSIS — E669 Obesity, unspecified: Secondary | ICD-10-CM | POA: Diagnosis present

## 2021-05-09 DIAGNOSIS — W57XXXA Bitten or stung by nonvenomous insect and other nonvenomous arthropods, initial encounter: Secondary | ICD-10-CM | POA: Diagnosis present

## 2021-05-09 DIAGNOSIS — Z975 Presence of (intrauterine) contraceptive device: Secondary | ICD-10-CM | POA: Diagnosis not present

## 2021-05-09 DIAGNOSIS — L02212 Cutaneous abscess of back [any part, except buttock]: Secondary | ICD-10-CM | POA: Diagnosis present

## 2021-05-09 DIAGNOSIS — Z888 Allergy status to other drugs, medicaments and biological substances status: Secondary | ICD-10-CM

## 2021-05-09 DIAGNOSIS — E871 Hypo-osmolality and hyponatremia: Secondary | ICD-10-CM | POA: Diagnosis present

## 2021-05-09 DIAGNOSIS — L03312 Cellulitis of back [any part except buttock]: Secondary | ICD-10-CM | POA: Diagnosis present

## 2021-05-09 DIAGNOSIS — J302 Other seasonal allergic rhinitis: Secondary | ICD-10-CM | POA: Diagnosis present

## 2021-05-09 DIAGNOSIS — I10 Essential (primary) hypertension: Secondary | ICD-10-CM | POA: Diagnosis present

## 2021-05-09 DIAGNOSIS — D509 Iron deficiency anemia, unspecified: Secondary | ICD-10-CM | POA: Diagnosis present

## 2021-05-09 DIAGNOSIS — E11628 Type 2 diabetes mellitus with other skin complications: Secondary | ICD-10-CM | POA: Diagnosis present

## 2021-05-09 DIAGNOSIS — Z7984 Long term (current) use of oral hypoglycemic drugs: Secondary | ICD-10-CM | POA: Diagnosis not present

## 2021-05-09 DIAGNOSIS — A419 Sepsis, unspecified organism: Secondary | ICD-10-CM | POA: Diagnosis present

## 2021-05-09 DIAGNOSIS — L0390 Cellulitis, unspecified: Secondary | ICD-10-CM | POA: Diagnosis present

## 2021-05-09 DIAGNOSIS — E785 Hyperlipidemia, unspecified: Secondary | ICD-10-CM | POA: Diagnosis present

## 2021-05-09 DIAGNOSIS — R739 Hyperglycemia, unspecified: Secondary | ICD-10-CM

## 2021-05-09 DIAGNOSIS — Z791 Long term (current) use of non-steroidal anti-inflammatories (NSAID): Secondary | ICD-10-CM

## 2021-05-09 DIAGNOSIS — E1165 Type 2 diabetes mellitus with hyperglycemia: Secondary | ICD-10-CM | POA: Diagnosis present

## 2021-05-09 DIAGNOSIS — B9689 Other specified bacterial agents as the cause of diseases classified elsewhere: Secondary | ICD-10-CM | POA: Diagnosis not present

## 2021-05-09 DIAGNOSIS — L03818 Cellulitis of other sites: Secondary | ICD-10-CM

## 2021-05-09 DIAGNOSIS — Z8614 Personal history of Methicillin resistant Staphylococcus aureus infection: Secondary | ICD-10-CM | POA: Diagnosis not present

## 2021-05-09 DIAGNOSIS — Z20822 Contact with and (suspected) exposure to covid-19: Secondary | ICD-10-CM | POA: Diagnosis present

## 2021-05-09 DIAGNOSIS — Z6833 Body mass index (BMI) 33.0-33.9, adult: Secondary | ICD-10-CM

## 2021-05-09 DIAGNOSIS — E1142 Type 2 diabetes mellitus with diabetic polyneuropathy: Secondary | ICD-10-CM | POA: Diagnosis present

## 2021-05-09 DIAGNOSIS — F17200 Nicotine dependence, unspecified, uncomplicated: Secondary | ICD-10-CM | POA: Diagnosis present

## 2021-05-09 DIAGNOSIS — R7989 Other specified abnormal findings of blood chemistry: Secondary | ICD-10-CM

## 2021-05-09 DIAGNOSIS — F17213 Nicotine dependence, cigarettes, with withdrawal: Secondary | ICD-10-CM

## 2021-05-09 DIAGNOSIS — R0602 Shortness of breath: Secondary | ICD-10-CM

## 2021-05-09 LAB — CBC WITH DIFFERENTIAL/PLATELET
Abs Immature Granulocytes: 0.1 10*3/uL — ABNORMAL HIGH (ref 0.00–0.07)
Basophils Absolute: 0.1 10*3/uL (ref 0.0–0.1)
Basophils Relative: 0 %
Eosinophils Absolute: 0.1 10*3/uL (ref 0.0–0.5)
Eosinophils Relative: 1 %
HCT: 37.3 % (ref 36.0–46.0)
Hemoglobin: 12.1 g/dL (ref 12.0–15.0)
Immature Granulocytes: 1 %
Lymphocytes Relative: 10 %
Lymphs Abs: 1.6 10*3/uL (ref 0.7–4.0)
MCH: 26.2 pg (ref 26.0–34.0)
MCHC: 32.4 g/dL (ref 30.0–36.0)
MCV: 80.9 fL (ref 80.0–100.0)
Monocytes Absolute: 0.9 10*3/uL (ref 0.1–1.0)
Monocytes Relative: 6 %
Neutro Abs: 13.7 10*3/uL — ABNORMAL HIGH (ref 1.7–7.7)
Neutrophils Relative %: 82 %
Platelets: 248 10*3/uL (ref 150–400)
RBC: 4.61 MIL/uL (ref 3.87–5.11)
RDW: 13.5 % (ref 11.5–15.5)
WBC: 16.5 10*3/uL — ABNORMAL HIGH (ref 4.0–10.5)
nRBC: 0 % (ref 0.0–0.2)

## 2021-05-09 LAB — COMPREHENSIVE METABOLIC PANEL
ALT: 11 U/L (ref 0–44)
AST: 12 U/L — ABNORMAL LOW (ref 15–41)
Albumin: 4 g/dL (ref 3.5–5.0)
Alkaline Phosphatase: 99 U/L (ref 38–126)
Anion gap: 12 (ref 5–15)
BUN: 18 mg/dL (ref 6–20)
CO2: 22 mmol/L (ref 22–32)
Calcium: 9.7 mg/dL (ref 8.9–10.3)
Chloride: 100 mmol/L (ref 98–111)
Creatinine, Ser: 0.71 mg/dL (ref 0.44–1.00)
GFR, Estimated: >60 mL/min (ref >60)
Glucose, Bld: 404 mg/dL — ABNORMAL HIGH (ref 70–99)
Potassium: 4.1 mmol/L (ref 3.5–5.1)
Sodium: 134 mmol/L — ABNORMAL LOW (ref 135–145)
Total Bilirubin: 0.3 mg/dL (ref 0.3–1.2)
Total Protein: 7.9 g/dL (ref 6.5–8.1)

## 2021-05-09 LAB — CBG MONITORING, ED
Glucose-Capillary: 394 mg/dL — ABNORMAL HIGH (ref 70–99)
Glucose-Capillary: 339 mg/dL — ABNORMAL HIGH (ref 70–99)

## 2021-05-09 LAB — APTT: aPTT: 27 seconds (ref 24–36)

## 2021-05-09 LAB — LACTIC ACID, PLASMA
Lactic Acid, Venous: 1.4 mmol/L (ref 0.5–1.9)
Lactic Acid, Venous: 1.5 mmol/L (ref 0.5–1.9)

## 2021-05-09 LAB — GLUCOSE, CAPILLARY
Glucose-Capillary: 253 mg/dL — ABNORMAL HIGH (ref 70–99)
Glucose-Capillary: 297 mg/dL — ABNORMAL HIGH (ref 70–99)
Glucose-Capillary: 339 mg/dL — ABNORMAL HIGH (ref 70–99)

## 2021-05-09 LAB — RESP PANEL BY RT-PCR (FLU A&B, COVID) ARPGX2
Influenza A by PCR: NEGATIVE
Influenza B by PCR: NEGATIVE
SARS Coronavirus 2 by RT PCR: NEGATIVE

## 2021-05-09 LAB — PROCALCITONIN: Procalcitonin: <0.1 ng/mL

## 2021-05-09 LAB — MRSA PCR SCREENING: MRSA by PCR: POSITIVE — AB

## 2021-05-09 LAB — HEMOGLOBIN A1C
Hgb A1c MFr Bld: 8.2 % — ABNORMAL HIGH (ref 4.8–5.6)
Mean Plasma Glucose: 188.64 mg/dL

## 2021-05-09 LAB — PROTIME-INR
INR: 0.9 (ref 0.8–1.2)
Prothrombin Time: 12.6 seconds (ref 11.4–15.2)

## 2021-05-09 LAB — HIV ANTIBODY (ROUTINE TESTING W REFLEX): HIV Screen 4th Generation wRfx: NONREACTIVE

## 2021-05-09 LAB — TROPONIN I (HIGH SENSITIVITY)
Troponin I (High Sensitivity): 14 ng/L (ref <18)
Troponin I (High Sensitivity): 15 ng/L (ref <18)

## 2021-05-09 LAB — PREGNANCY, URINE: Preg Test, Ur: NEGATIVE

## 2021-05-09 LAB — MAGNESIUM: Magnesium: 1.8 mg/dL (ref 1.7–2.4)

## 2021-05-09 LAB — D-DIMER, QUANTITATIVE: D-Dimer, Quant: 1.14 ug/mL-FEU — ABNORMAL HIGH (ref 0.00–0.50)

## 2021-05-09 MED ORDER — GABAPENTIN 100 MG PO CAPS
100.0000 mg | ORAL_CAPSULE | Freq: Every day | ORAL | Status: DC
  Administered 2021-05-09 – 2021-05-10 (×2): 100 mg via ORAL
  Filled 2021-05-09 (×2): qty 1

## 2021-05-09 MED ORDER — INSULIN ASPART 100 UNIT/ML IJ SOLN
0.0000 [IU] | INTRAMUSCULAR | Status: DC
  Administered 2021-05-09: 15 [IU] via SUBCUTANEOUS
  Filled 2021-05-09: qty 1

## 2021-05-09 MED ORDER — NICOTINE 14 MG/24HR TD PT24
14.0000 mg | MEDICATED_PATCH | Freq: Every day | TRANSDERMAL | Status: DC
  Administered 2021-05-09 – 2021-05-10 (×2): 14 mg via TRANSDERMAL
  Filled 2021-05-09 (×2): qty 1

## 2021-05-09 MED ORDER — ENOXAPARIN SODIUM 60 MG/0.6ML IJ SOSY
0.5000 mg/kg | PREFILLED_SYRINGE | INTRAMUSCULAR | Status: DC
  Administered 2021-05-09 – 2021-05-17 (×8): 50 mg via SUBCUTANEOUS
  Filled 2021-05-09 (×8): qty 0.6

## 2021-05-09 MED ORDER — IBUPROFEN 400 MG PO TABS
400.0000 mg | ORAL_TABLET | Freq: Once | ORAL | Status: AC
  Administered 2021-05-09: 400 mg via ORAL
  Filled 2021-05-09: qty 1

## 2021-05-09 MED ORDER — CHLORHEXIDINE GLUCONATE CLOTH 2 % EX PADS
6.0000 | MEDICATED_PAD | Freq: Every day | CUTANEOUS | Status: AC
  Administered 2021-05-10 – 2021-05-14 (×5): 6 via TOPICAL

## 2021-05-09 MED ORDER — ACETAMINOPHEN 500 MG PO TABS
1000.0000 mg | ORAL_TABLET | Freq: Once | ORAL | Status: AC
  Administered 2021-05-09: 1000 mg via ORAL
  Filled 2021-05-09: qty 2

## 2021-05-09 MED ORDER — ONDANSETRON HCL 4 MG/2ML IJ SOLN
4.0000 mg | Freq: Once | INTRAMUSCULAR | Status: AC
  Administered 2021-05-09: 4 mg via INTRAVENOUS
  Filled 2021-05-09: qty 2

## 2021-05-09 MED ORDER — SODIUM CHLORIDE 0.45 % IV SOLN: INTRAVENOUS | Status: DC

## 2021-05-09 MED ORDER — SODIUM CHLORIDE 0.9 % IV SOLN
1.0000 g | Freq: Three times a day (TID) | INTRAVENOUS | Status: DC
  Administered 2021-05-09 – 2021-05-11 (×7): 1 g via INTRAVENOUS
  Filled 2021-05-09 (×6): qty 1
  Filled 2021-05-09 (×2): qty 10
  Filled 2021-05-09: qty 1
  Filled 2021-05-09: qty 10

## 2021-05-09 MED ORDER — SENNA 8.6 MG PO TABS
1.0000 | ORAL_TABLET | Freq: Two times a day (BID) | ORAL | Status: DC
  Administered 2021-05-09 – 2021-05-17 (×17): 8.6 mg via ORAL
  Filled 2021-05-09 (×17): qty 1

## 2021-05-09 MED ORDER — IBUPROFEN 400 MG PO TABS
400.0000 mg | ORAL_TABLET | Freq: Once | ORAL | Status: AC
  Administered 2021-05-09: 17:00:00 400 mg via ORAL
  Filled 2021-05-09: qty 1

## 2021-05-09 MED ORDER — LACTATED RINGERS IV BOLUS
30.0000 mL/kg | Freq: Once | INTRAVENOUS | Status: AC
  Administered 2021-05-09: 1917 mL via INTRAVENOUS

## 2021-05-09 MED ORDER — MORPHINE SULFATE (PF) 4 MG/ML IV SOLN
4.0000 mg | Freq: Once | INTRAVENOUS | Status: AC
  Administered 2021-05-09: 4 mg via INTRAVENOUS
  Filled 2021-05-09: qty 1

## 2021-05-09 MED ORDER — TRAMADOL HCL 50 MG PO TABS
50.0000 mg | ORAL_TABLET | Freq: Four times a day (QID) | ORAL | Status: DC | PRN
  Administered 2021-05-09 – 2021-05-11 (×7): 50 mg via ORAL
  Filled 2021-05-09 (×7): qty 1

## 2021-05-09 MED ORDER — INSULIN ASPART 100 UNIT/ML IJ SOLN
0.0000 [IU] | Freq: Three times a day (TID) | INTRAMUSCULAR | Status: DC
  Administered 2021-05-09 – 2021-05-10 (×5): 11 [IU] via SUBCUTANEOUS
  Administered 2021-05-11: 15 [IU] via SUBCUTANEOUS
  Administered 2021-05-11 (×2): 7 [IU] via SUBCUTANEOUS
  Administered 2021-05-12: 09:00:00 15 [IU] via SUBCUTANEOUS
  Administered 2021-05-12 – 2021-05-13 (×3): 11 [IU] via SUBCUTANEOUS
  Administered 2021-05-13: 09:00:00 15 [IU] via SUBCUTANEOUS
  Administered 2021-05-13: 4 [IU] via SUBCUTANEOUS
  Administered 2021-05-14: 09:00:00 15 [IU] via SUBCUTANEOUS
  Administered 2021-05-14 (×2): 7 [IU] via SUBCUTANEOUS
  Administered 2021-05-15: 20 [IU] via SUBCUTANEOUS
  Administered 2021-05-15 – 2021-05-16 (×3): 7 [IU] via SUBCUTANEOUS
  Administered 2021-05-16: 4 [IU] via SUBCUTANEOUS
  Administered 2021-05-16: 14:00:00 15 [IU] via SUBCUTANEOUS
  Administered 2021-05-17: 7 [IU] via SUBCUTANEOUS
  Administered 2021-05-17: 11 [IU] via SUBCUTANEOUS
  Filled 2021-05-09 (×23): qty 1

## 2021-05-09 MED ORDER — INSULIN ASPART 100 UNIT/ML IJ SOLN
0.0000 [IU] | Freq: Every day | INTRAMUSCULAR | Status: DC
  Administered 2021-05-09: 22:00:00 4 [IU] via SUBCUTANEOUS
  Administered 2021-05-10: 22:00:00 3 [IU] via SUBCUTANEOUS
  Administered 2021-05-11: 22:00:00 4 [IU] via SUBCUTANEOUS
  Administered 2021-05-12: 3 [IU] via SUBCUTANEOUS
  Administered 2021-05-13: 4 [IU] via SUBCUTANEOUS
  Administered 2021-05-14: 23:00:00 3 [IU] via SUBCUTANEOUS
  Administered 2021-05-15: 4 [IU] via SUBCUTANEOUS
  Administered 2021-05-16: 5 [IU] via SUBCUTANEOUS
  Filled 2021-05-09 (×8): qty 1

## 2021-05-09 MED ORDER — ONDANSETRON HCL 4 MG PO TABS
4.0000 mg | ORAL_TABLET | Freq: Four times a day (QID) | ORAL | Status: DC | PRN
  Administered 2021-05-16: 4 mg via ORAL
  Filled 2021-05-09: qty 1

## 2021-05-09 MED ORDER — VANCOMYCIN HCL IN DEXTROSE 1-5 GM/200ML-% IV SOLN
1000.0000 mg | Freq: Once | INTRAVENOUS | Status: AC
  Administered 2021-05-09: 1000 mg via INTRAVENOUS
  Filled 2021-05-09: qty 200

## 2021-05-09 MED ORDER — ACETAMINOPHEN 650 MG RE SUPP: 650.0000 mg | Freq: Four times a day (QID) | RECTAL | Status: DC | PRN

## 2021-05-09 MED ORDER — ATORVASTATIN CALCIUM 20 MG PO TABS
80.0000 mg | ORAL_TABLET | Freq: Every day | ORAL | Status: DC
  Administered 2021-05-09 – 2021-05-17 (×9): 80 mg via ORAL
  Filled 2021-05-09 (×9): qty 4

## 2021-05-09 MED ORDER — ACETAMINOPHEN 325 MG PO TABS
650.0000 mg | ORAL_TABLET | Freq: Four times a day (QID) | ORAL | Status: DC | PRN
  Administered 2021-05-09 – 2021-05-13 (×11): 650 mg via ORAL
  Filled 2021-05-09 (×11): qty 2

## 2021-05-09 MED ORDER — MUPIROCIN 2 % EX OINT
1.0000 "application " | TOPICAL_OINTMENT | Freq: Two times a day (BID) | CUTANEOUS | Status: AC
  Administered 2021-05-09 – 2021-05-13 (×10): 1 via NASAL
  Filled 2021-05-09: qty 22

## 2021-05-09 MED ORDER — ONDANSETRON HCL 4 MG/2ML IJ SOLN: 4.0000 mg | Freq: Four times a day (QID) | INTRAMUSCULAR | Status: DC | PRN

## 2021-05-09 MED ORDER — LORATADINE 10 MG PO TABS
10.0000 mg | ORAL_TABLET | Freq: Every day | ORAL | Status: DC
  Administered 2021-05-09 – 2021-05-17 (×9): 10 mg via ORAL
  Filled 2021-05-09 (×9): qty 1

## 2021-05-09 MED ORDER — POLYETHYLENE GLYCOL 3350 17 G PO PACK
17.0000 g | PACK | Freq: Every day | ORAL | Status: DC | PRN
  Administered 2021-05-10: 17 g via ORAL
  Filled 2021-05-09 (×3): qty 1

## 2021-05-09 MED ORDER — POLYETHYLENE GLYCOL 3350 17 GM/SCOOP PO POWD: 1.0000 | Freq: Every day | ORAL | Status: DC

## 2021-05-09 MED ORDER — SODIUM CHLORIDE 0.9 % IV SOLN
2.0000 g | Freq: Once | INTRAVENOUS | Status: AC
  Administered 2021-05-09: 2 g via INTRAVENOUS
  Filled 2021-05-09: qty 20

## 2021-05-09 NOTE — ED Provider Notes (Signed)
[facility] Emergency Department Provider Note  ____________________________________________   Event Date/Time   First MD Initiated Contact with Patient 05/09/21 [phone]     (approximate)  I have reviewed the triage vital signs and the nursing notes.   HISTORY  Chief Complaint Fever, Insect Bite, and Shortness of Breath   HPI Ashley Snow is a 50 y.o. female with past medical history of HTN, HDL, DM and previous MRSA infections who presents for assessment of an area of redness pain and swelling in her left upper back she states she noticed there 4 days ago.  She states she is at a group home that she thinks is infested with bedbugs and thinks he was bitten in this location.  She has been scratching a little bit.  Since then is gotten bigger more red and painful.  She states she has also had some shortness of breath whenever she has been exerting herself and has been coughing.  She endorses fever, nausea and decreased appetite as well as inability to get her medications in the last couple days but has not had any earache, sore throat, chest pain,  vomiting, diarrhea, dysuria, abdominal pain, lower back pain, injuries or falls or specific acute joint pain.  No other acute concerns at this time.  She denies any illicit drug use or EtOH use but endorses tobacco abuse.  She states her tetanus was updated within the last 5 years.         Past Medical History:  Diagnosis Date  . Diabetes mellitus without complication ([place])   . Hyperlipidemia   . Hypertension   . Seasonal allergies     Patient Active Problem List   Diagnosis Date Noted  . Cellulitis 05/09/2021  . Encounter to establish care 04/27/2021  . History of seasonal allergies 04/27/2021  . Type 2 diabetes mellitus with diabetic neuropathy, unspecified ([place]) 04/27/2021  . Hypotension 04/27/2021  . Hyperlipidemia 08/10/2016  . Hypertension 04/14/2016  . Diabetes ([place]) 04/14/2016  . Acute pancreatitis  03/03/2016  . Pancreatitis 03/03/2016  . Headache 10/02/2015    Past Surgical History:  Procedure Laterality Date  . CESAREAN SECTION    . KIDNEY STONE SURGERY    . TUBAL LIGATION      Prior to Admission medications   Medication Sig Start Date End Date Taking? Authorizing Provider  atorvastatin (LIPITOR) 80 MG tablet Take 80 mg by mouth daily.    [provider]  Blood Pressure KIT Use as directed in the morning and at bedtime. 04/27/21   Iloabachie, Chioma E, NP  cetirizine (ZYRTEC ALLERGY) 10 MG tablet Take 1 tablet (10 mg total) by mouth daily. 04/27/21   Iloabachie, Chioma E, NP  gabapentin (NEURONTIN) 100 MG capsule Take 1 capsule (100 mg total) by mouth at bedtime. 04/27/21   Iloabachie, Chioma E, NP  levonorgestrel (MIRENA) 20 MCG/DAY IUD 1 each by Intrauterine route once.    [provider]  metFORMIN (GLUCOPHAGE) 1000 MG tablet Take 1,000 mg by mouth in the morning and at bedtime.    [provider]  Multiple Vitamins-Minerals (MULTIVITAMIN WITH MINERALS) tablet Take 1 tablet by mouth daily.    [provider]  naproxen (NAPROSYN) 500 MG tablet Take 500 mg by mouth in the morning and at bedtime.    [provider]  polyethylene glycol powder (GLYCOLAX/MIRALAX) 17 GM/SCOOP powder Take 1 Container by mouth daily. 1 scoop daily    [provider]    Allergies Iodine  Family History  Problem Relation Age of Onset  . Dementia Mother   . Hypotension Mother   . Diabetes Mother   . Alcoholism Father   . Hypertension Father   . Hyperlipidemia Father   . Dementia Maternal Grandmother   . Alzheimer's disease Maternal Grandmother     Social History Social History   Tobacco Use  . Smoking status: Current Every Day Smoker    Packs/day: 0.50    Types: Cigarettes  . Smokeless tobacco: Never Used  Vaping Use  . Vaping Use: Never used  Substance Use Topics  . Alcohol use: Yes    Comment: social  . Drug use: Not Currently     Types: "Crack" cocaine    Comment: last use 09/2020    Review of Systems  Review of Systems  Constitutional: Positive for chills and fever.  HENT: Negative for sore throat.   Eyes: Negative for pain.  Respiratory: Positive for cough and shortness of breath. Negative for stridor.   Cardiovascular: Negative for chest pain.  Gastrointestinal: Positive for constipation and nausea. Negative for abdominal pain, diarrhea and vomiting.  Musculoskeletal: Positive for back pain ( upper left) and myalgias ( upper L back).  Skin: Negative for rash.  Neurological: Negative for seizures, loss of consciousness and headaches.  Psychiatric/Behavioral: Negative for suicidal ideas.  All other systems reviewed and are negative.     ____________________________________________   PHYSICAL EXAM:  VITAL SIGNS: ED Triage Vitals  Enc Vitals Group     BP      Pulse      Resp      Temp      Temp src      SpO2      Weight      Height      Head Circumference      Peak Flow      Pain Score      Pain Loc      Pain Edu?      Excl. in [place]?    Vitals:   05/09/21 0830 05/09/21 0845  BP: (!) 158/87   Pulse: (!) 104 (!) 108  Resp: 20 (!) 26  Temp:    SpO2: 98% 96%   Physical Exam Vitals and nursing note reviewed.  Constitutional:      General: She is in acute distress.     Appearance: She is well-developed. She is ill-appearing.  HENT:     Head: Normocephalic and atraumatic.     Right Ear: External ear normal.     Left Ear: External ear normal.     Nose: Nose normal.     Mouth/Throat:     Mouth: Mucous membranes are dry.  Eyes:     Conjunctiva/sclera: Conjunctivae normal.  Cardiovascular:     Rate and Rhythm: Regular rhythm. Tachycardia present.     Heart sounds: No murmur heard.   Pulmonary:     Effort: Pulmonary effort is normal. No respiratory distress.     Breath sounds: Normal breath sounds. No decreased breath sounds.  Abdominal:     Palpations: Abdomen is soft.      Tenderness: There is no abdominal tenderness.  Musculoskeletal:     Cervical back: Neck supple.  Skin:    General: Skin is warm and dry.     Capillary Refill: Capillary refill takes more than 3 seconds.  Neurological:     Mental Status: She is alert and oriented to person, place, and time.  Psychiatric:        Mood  and Affect: Mood normal.     Patient has approximately 3 x 5 cm oval-shaped area of erythema induration warmth and tenderness over her left mid scapula.  There is also a small area approximately 0.5 cm circular diameter just immediately inferior lateral to this.  No other obvious overlying skin changes of patient's back. ____________________________________________   LABS (all labs ordered are listed, but only abnormal results are displayed)  Labs Reviewed  COMPREHENSIVE METABOLIC PANEL - Abnormal; Notable for the following components:      Result Value   Sodium 134 (*)    Glucose, Bld 404 (*)    AST 12 (*)    All other components within normal limits  CBC WITH DIFFERENTIAL/PLATELET - Abnormal; Notable for the following components:   WBC 16.5 (*)    Neutro Abs 13.7 (*)    Abs Immature Granulocytes 0.10 (*)    All other components within normal limits  D-DIMER, QUANTITATIVE - Abnormal; Notable for the following components:   D-Dimer, Quant 1.14 (*)    All other components within normal limits  CBG MONITORING, ED - Abnormal; Notable for the following components:   Glucose-Capillary 394 (*)    All other components within normal limits  CULTURE, BLOOD (SINGLE)  RESP PANEL BY RT-PCR (FLU A&B, COVID) ARPGX2  LACTIC ACID, PLASMA  PROTIME-INR  APTT  MAGNESIUM  PROCALCITONIN  LACTIC ACID, PLASMA  HEMOGLOBIN A1C  POC URINE PREG, ED  TROPONIN I (HIGH SENSITIVITY)   ____________________________________________  EKG  Sinus tachycardia with ventricular to 106, normal axis, unremarkable intervals, Q-wave in lead III and aVF without any other clearance of acute ischemia  or significant underlying arrhythmia. ____________________________________________  RADIOLOGY  ED MD interpretation: No focal consolidation, large effusion, overt edema, pneumothorax or any other clear acute intrathoracic process.  Official radiology report(s): DG Chest Port 1 View  Result Date: 05/09/2021 CLINICAL DATA:  Sepsis.  Shortness of breath and nausea. EXAM: PORTABLE CHEST 1 VIEW COMPARISON:  04/24/2015 FINDINGS: The heart size and mediastinal contours are within normal limits. Both lungs are clear. The visualized skeletal structures are unremarkable. IMPRESSION: No active disease. Electronically Signed   By: Kerby Moors M.D.   On: 05/09/2021 07:54    ____________________________________________   PROCEDURES  Procedure(s) performed (including Critical Care):  .Critical Care Performed by: Lucrezia Starch, MD Authorized by: Lucrezia Starch, MD   Critical care provider statement:    Critical care time (minutes):  45   Critical care was necessary to treat or prevent imminent or life-threatening deterioration of the following conditions:  Sepsis   Critical care was time spent personally by me on the following activities:  Discussions with consultants, evaluation of patient's response to treatment, examination of patient, ordering and performing treatments and interventions, ordering and review of laboratory studies, ordering and review of radiographic studies, pulse oximetry, re-evaluation of patient's condition, obtaining history from patient or surrogate and review of old charts     ____________________________________________   INITIAL IMPRESSION / ASSESSMENT AND PLAN / ED COURSE       Patient presents with above-stated history exam for assessment of worsening pain redness and swelling and a spot in her upper left back prior she thinks she was initially bit by a bug but has been scratching the last couple days.  He states she is worried about possible MRSA  infections..  He also states he has had some shortness of breath whenever she has been exerting as of the last couple days.  She is  also not been able to take her medications.  On arrival she is febrile at 100.5, tachycardic at 114 and hypertensive with a BP of 167/92 with otherwise stable vital signs on room air.  On exam she does have fairly large area of erythema induration tenderness and warmth over her left scapula is much smaller nonindurated circular area just inferior lateral to this.  Lungs are clear but he has slightly decreased breath sounds bilaterally.  No rales rhonchi's or wheezes.  On exam patient has clear evidence of a cellulitis on her back.  In addition she met sepsis criteria with fever and tachycardia.  POCUS exam without clear focal discrete collection suggestive of abscess not amenable to drainage at this time.  Certainly possible patient sepsis is etiology for her shortness of breath with exertion.  Chest x-ray does not have evidence of focal consolidation to suggest acute bacterial pneumonia.  She does not appear grossly volume overloaded to suggest acute heart failure and there is no evidence of significant edema on x-ray.  She denies any chest pain and normal ECG has some nonspecific findings given nonelevated troponin I have a low suspicion for ACS at this time.  She does not appear to be acutely anemic.    CMP remarkable for glucose of 404 without evidence of acidosis or any other significant derangements.  Lactic acid is not elevated.  CBC with leukocytosis with WBC count of 16.5 without any other significant derangements.  Magnesium WNL.  Troponin 14.  D-dimer sent to assess patient's risk for PE.  He was started on broad-spectrum antibiotics and resuscitated with fluids at 30 cc/kg on arrival.  She was given Tylenol and morphine.  She was also placed on sliding scale insulin for hyperglycemia.  D-dimer is elevated at 1.14.  I discussed this with hospitalist who recommended  obtaining bilateral lower extremity Dopplers and seeing how patient responded to fluids and antibiotics with plan to hold off on VQ scan at this time given concern she will likely be of normal as she has a history of smoking and has been coughing as well and may have a bronchitis.  She is allergic to iodine and so she cannot get a stat CTA at this time.  I think this is reasonable.  We will start with lower extremity Dopplers.  Will admit to medicine service for further evaluation and management.      ____________________________________________   FINAL CLINICAL IMPRESSION(S) / ED DIAGNOSES  Final diagnoses:  Cellulitis of back except buttock  Sepsis, due to unspecified organism, unspecified whether acute organ dysfunction present (HCC)  SOB (shortness of breath)  Hyperglycemia  Positive D dimer    Medications  vancomycin (VANCOCIN) IVPB 1000 mg/200 mL premix (1,000 mg Intravenous New Bag/Given 05/09/21 0811)  insulin aspart (novoLOG) injection 0-15 Units (15 Units Subcutaneous Given 05/09/21 0813)  vancomycin (VANCOCIN) IVPB 1000 mg/200 mL premix (has no administration in time range)  acetaminophen (TYLENOL) tablet 1,000 mg (1,000 mg Oral Given 05/09/21 0812)  lactated ringers bolus 1,917 mL (1,917 mLs Intravenous New Bag/Given 05/09/21 0746)  cefTRIAXone (ROCEPHIN) 2 g in sodium chloride 0.9 % 100 mL IVPB (0 g Intravenous Stopped 05/09/21 0836)  ondansetron (ZOFRAN) injection 4 mg (4 mg Intravenous Given 05/09/21 0809)  morphine 4 MG/ML injection 4 mg (4 mg Intravenous Given 05/09/21 1287)     ED Discharge Orders    None       Note:  This document was prepared using Dragon voice recognition software and may include unintentional  dictation errors.   Lucrezia Starch, MD 05/09/21 [phone]

## 2021-05-09 NOTE — ED Notes (Signed)
Smith MD at bedside ?

## 2021-05-09 NOTE — ED Triage Notes (Signed)
Pt BIB EMS from home, endorses insect bite (states home has bed bugs) that is now swollen and red to the left upper back. Pt also endorses shortness of breath and nausea yesterday and today. Temp 101 with EMS, hx diabetes, hasn't had insulin x1 week.

## 2021-05-09 NOTE — ED Notes (Signed)
MD Katrinka Blazing made aware of 339 blood glucose.

## 2021-05-09 NOTE — H&P (Addendum)
History and Physical    Caileigh Zorn OZY:[id number] DOB: May 16, 1971 DOA: 05/09/2021  PCP: Patient, No Pcp Per (Inactive)   Patient coming from: Home (resides in a boardinghouse)  I have personally briefly reviewed patient's old medical records in [place]  Chief Complaint: Pain and swelling involving her upper back  HPI: Threasa Talamantez is a 50 y.o. female with medical history significant for insulin-dependent diabetes mellitus, obesity, hypertension, nicotine dependence who currently resides in a boarding house.  She presents to the ER for evaluation of pain, redness and swelling involving her left upper back. Patient states that since she moved into the boardinghouse she has had issues with bedbugs.  4 days prior to her admission she noticed a bump on her left upper back that was pruritic.  She popped the raised area and by the next day noticed some redness and swelling which has progressively worsened over the last couple of days.  She has also had chills and fever at home with a T-max of 101F in the emergency room.  She has had nausea but no vomiting and denies having any chest pain, no shortness of breath, no abdominal pain, no headache, no vomiting, no changes in her bowel habits, no urinary symptoms, no blurred vision, no focal deficits. She has been noncompliant with her insulin and states that she was advised to get blood work done before her insulin could be prescribed.  She had complained about feeling dizzy and lightheaded and was taken off her antihypertensive medications at the [facility] clinic. Labs show sodium 134, potassium 4.1, chloride 100, bicarb 22, glucose 404, BUN 18, creatinine 0.71, calcium 9.7, magnesium 1.8, alkaline phosphatase 99, albumin 4.0, AST 12, ALT 11, total protein 7.9, troponin 14, lactic acid 1.5, procalcitonin less than 0.10, white count 16.5, hemoglobin 12.1, hematocrit 37.3, MCV 80.9, RDW 13.5, platelet count 248, PT 12.6, INR 0.9 Respiratory viral panel  is negative Chest x-ray reviewed by me shows no acute cardiopulmonary disease Twelve-lead EKG reviewed by me shows sinus tachycardia.    ED Course: Patient is a 50 year old Caucasian female who presents to the emergency room for evaluation of pain, redness and swelling involving her left upper back following a bug bite.  She was febrile upon presentation with tachycardia, tachypnea and leukocytosis but a normal lactic acid level.  Examination shows cellulitis of her left upper back.   atient received IV antibiotic therapy and will be admitted to the hospital for further evaluation.    Review of Systems: As per HPI otherwise all other systems reviewed and negative.    Past Medical History:  Diagnosis Date  . Diabetes mellitus without complication ([place])   . Hyperlipidemia   . Hypertension   . Seasonal allergies     Past Surgical History:  Procedure Laterality Date  . CESAREAN SECTION    . KIDNEY STONE SURGERY    . TUBAL LIGATION       reports that she has been smoking cigarettes. She has been smoking about 0.50 packs per day. She has never used smokeless tobacco. She reports current alcohol use. She reports previous drug use. Drug: "Crack" cocaine.  Allergies  Allergen Reactions  . Iodine Rash    Family History  Problem Relation Age of Onset  . Dementia Mother   . Hypotension Mother   . Diabetes Mother   . Alcoholism Father   . Hypertension Father   . Hyperlipidemia Father   . Dementia Maternal Grandmother   . Alzheimer's disease Maternal Grandmother  Prior to Admission medications   Medication Sig Start Date End Date Taking? Authorizing Provider  atorvastatin (LIPITOR) 80 MG tablet Take 80 mg by mouth daily.    [provider]  Blood Pressure KIT Use as directed in the morning and at bedtime. 04/27/21   Iloabachie, Chioma E, NP  cetirizine (ZYRTEC ALLERGY) 10 MG tablet Take 1 tablet (10 mg total) by mouth daily. 04/27/21   Iloabachie, Chioma E, NP   gabapentin (NEURONTIN) 100 MG capsule Take 1 capsule (100 mg total) by mouth at bedtime. 04/27/21   Iloabachie, Chioma E, NP  levonorgestrel (MIRENA) 20 MCG/DAY IUD 1 each by Intrauterine route once.    [provider]  metFORMIN (GLUCOPHAGE) 1000 MG tablet Take 1,000 mg by mouth in the morning and at bedtime.    [provider]  Multiple Vitamins-Minerals (MULTIVITAMIN WITH MINERALS) tablet Take 1 tablet by mouth daily.    [provider]  naproxen (NAPROSYN) 500 MG tablet Take 500 mg by mouth in the morning and at bedtime.    [provider]  polyethylene glycol powder (GLYCOLAX/MIRALAX) 17 GM/SCOOP powder Take 1 Container by mouth daily. 1 scoop daily    [provider]    Physical Exam: Vitals:   05/09/21 0900 05/09/21 0906 05/09/21 0915 05/09/21 1011  BP: (!) 164/90     Pulse: (!) 102  95   Resp: (!) 28  18   Temp:  (!) 101.1 F (38.4 C)  98 F (36.7 C)  TempSrc:  Oral  Oral  SpO2: 95%  95%   Weight:      Height:         Vitals:   05/09/21 0900 05/09/21 0906 05/09/21 0915 05/09/21 1011  BP: (!) 164/90     Pulse: (!) 102  95   Resp: (!) 28  18   Temp:  (!) 101.1 F (38.4 C)  98 F (36.7 C)  TempSrc:  Oral  Oral  SpO2: 95%  95%   Weight:      Height:          Constitutional: Alert and oriented x 3 . Not in any apparent distress HEENT:      Head: Normocephalic and atraumatic.         Eyes: PERLA, EOMI, Conjunctivae are normal. Sclera is non-icteric.       Mouth/Throat: Mucous membranes are moist.       Neck: Supple with no signs of meningismus. Cardiovascular:  Tachycardia. No murmurs, gallops, or rubs. 2+ symmetrical distal pulses are present . No JVD. No LE edema Respiratory: Respiratory effort normal .Lungs sounds clear bilaterally. No wheezes, crackles, or rhonchi.  Gastrointestinal: Soft, non tender, and non distended with positive bowel sounds.  Genitourinary: No CVA tenderness. Musculoskeletal:  Redness, swelling  and indurated area over the left upper back with differential warmth.   Neurologic:  Face is symmetric. Moving all extremities. No gross focal neurologic deficits . Skin:  Redness, differential warmth over left upper back with induration Psychiatric: Mood and affect are normal tests   Labs on Admission: I have personally reviewed following labs and imaging studies  CBC: Recent Labs  Lab 05/09/21 0734  WBC 16.5*  NEUTROABS 13.7*  HGB 12.1  HCT 37.3  MCV 80.9  PLT 268   Basic Metabolic Panel: Recent Labs  Lab 05/09/21 0734  NA 134*  K 4.1  CL 100  CO2 22  GLUCOSE 404*  BUN 18  CREATININE 0.71  CALCIUM 9.7  MG 1.8  GFR: Estimated Creatinine Clearance: 102.6 mL/min (by C-G formula based on SCr of 0.71 mg/dL). Liver Function Tests: Recent Labs  Lab 05/09/21 0734  AST 12*  ALT 11  ALKPHOS 99  BILITOT 0.3  PROT 7.9  ALBUMIN 4.0   No results for input(s): LIPASE, AMYLASE in the last 168 hours. No results for input(s): AMMONIA in the last 168 hours. Coagulation Profile: Recent Labs  Lab 05/09/21 0734  INR 0.9   Cardiac Enzymes: No results for input(s): CKTOTAL, CKMB, CKMBINDEX, TROPONINI in the last 168 hours. BNP (last 3 results) No results for input(s): PROBNP in the last 8760 hours. HbA1C: No results for input(s): HGBA1C in the last 72 hours. CBG: Recent Labs  Lab 05/09/21 0738 05/09/21 0905  GLUCAP 394* 339*   Lipid Profile: No results for input(s): CHOL, HDL, LDLCALC, TRIG, CHOLHDL, LDLDIRECT in the last 72 hours. Thyroid Function Tests: No results for input(s): TSH, T4TOTAL, FREET4, T3FREE, THYROIDAB in the last 72 hours. Anemia Panel: No results for input(s): VITAMINB12, FOLATE, FERRITIN, TIBC, IRON, RETICCTPCT in the last 72 hours. Urine analysis:    Component Value Date/Time   COLORURINE YELLOW (A) 03/03/2016 0824   APPEARANCEUR Cloudy (A) 06/29/2016 1227   LABSPEC 1.031 (H) 03/03/2016 0824   LABSPEC 1.016 04/24/2015 2120   PHURINE 5.0  03/03/2016 0824   GLUCOSEU 3+ (A) 06/29/2016 1227   GLUCOSEU Negative 04/24/2015 2120   HGBUR 1+ (A) 03/03/2016 0824   BILIRUBINUR Negative 06/29/2016 1227   BILIRUBINUR Negative 04/24/2015 2120   KETONESUR 2+ (A) 03/03/2016 0824   PROTEINUR Trace 06/29/2016 1227   PROTEINUR NEGATIVE 03/03/2016 0824   NITRITE Negative 06/29/2016 1227   NITRITE NEGATIVE 03/03/2016 0824   LEUKOCYTESUR Trace (A) 06/29/2016 1227   LEUKOCYTESUR 1+ 04/24/2015 2120    Radiological Exams on Admission: DG Chest Port 1 View  Result Date: 05/09/2021 CLINICAL DATA:  Sepsis.  Shortness of breath and nausea. EXAM: PORTABLE CHEST 1 VIEW COMPARISON:  04/24/2015 FINDINGS: The heart size and mediastinal contours are within normal limits. Both lungs are clear. The visualized skeletal structures are unremarkable. IMPRESSION: No active disease. Electronically Signed   By: Kerby Moors M.D.   On: 05/09/2021 07:54     Assessment/Plan Principal Problem:   Cellulitis Active Problems:   Hypertension   Diabetes mellitus with hyperglycemia (HCC)   Nicotine dependence       Cellulitis of upper back (POA) Patient presents for evaluation of pain, swelling, redness involving the left upper back following a bug bite. She had a fever with a T-max of 101 F, she was tachycardic and tachypneic with normal lactic acid level as well as leukocytosis We will place patient on Ancef 1 g IV every 8 hours Follow-up results of blood cultures    Diabetes mellitus with hyperglycemia Secondary to medication noncompliance We will place patient on sliding scale insulin with Accu-Cheks before meals and at bedtime Continue IV fluid hydration Maintain consistent carbohydrate diet Patient will need medication assistance to obtain her insulin prior to discharge    Nicotine Dependence Smoking cessation was discussed with the patient in detail Will place patient on nicotine transdermal patch 14 mg daily    Obesity (BMI  33) Complicates overall prognosis and care Lifestyle modification and exercise has been discussed with patient in detail   DVT prophylaxis: Lovenox Code Status: full code Family Communication: Greater than 50% of time was spent discussing patient's condition and plan of care with her at the bedside.  All questions and concerns have been  addressed.  She verbalizes understanding and agrees with the plan. Disposition Plan: Back to previous home environment Consults called: none Status: At the time of admission, it appears that the appropriate admission status for this patient is inpatient. This is judged to be reasonable and necessary in order to provide the required intensity of service to ensure the patient's safety given the presenting symptoms, physical exam findings, and initial radiographic and laboratory data in the context of their comorbid conditions. Patient requires inpatient status due to high intensity of service, high risk for further deterioration and high frequency of surveillance required.    Collier Bullock MD [facility]     05/09/2021, 10:11 AM

## 2021-05-09 NOTE — Progress Notes (Signed)
   05/09/21 1615  Assess: MEWS Score  Temp (!) 100.7 F (38.2 C)  BP (!) 157/80  Pulse Rate (!) 105  Resp 17  SpO2 97 %  O2 Device Room Air  Assess: MEWS Score  MEWS Temp 1  MEWS Systolic 0  MEWS Pulse 1  MEWS RR 0  MEWS LOC 0  MEWS Score 2  MEWS Score Color Yellow  Assess: if the MEWS score is Yellow or Red  Were vital signs taken at a resting state? Yes  Focused Assessment Change from prior assessment (see assessment flowsheet)  Early Detection of Sepsis Score *See Row Information* Low  MEWS guidelines implemented *See Row Information* Yes  Treat  MEWS Interventions Other (Comment) (had given tylenol and temp is actually down from prior temp)  Pain Scale 0-10  Pain Score 0  Pain Type Acute pain  Pain Location Back  Pain Orientation Upper  Pain Intervention(s) Refused  Take Vital Signs  Increase Vital Sign Frequency  Yellow: Q 2hr X 2 then Q 4hr X 2, if remains yellow, continue Q 4hrs  Escalate  MEWS: Escalate Yellow: discuss with charge nurse/RN and consider discussing with provider and RRT  Notify: Charge Nurse/RN  Name of Charge Nurse/RN Notified Malka Sinaway  Date Charge Nurse/RN Notified 05/09/21  Time Charge Nurse/RN Notified 1618  Notify: Provider  Provider Name/Title Dr Joylene Igo  Date Provider Notified 05/09/21  Time Provider Notified 1621  Notification Type  (text)  Notification Reason Change in status (MEWS of 2)  Provider response See new orders (given ibupropen x1)  Date of Provider Response 05/09/21  Time of Provider Response 1628  Document  Patient Outcome Other (Comment) (med given contiue to assess)

## 2021-05-09 NOTE — Progress Notes (Signed)
elink monitoring for sepsis protocol 

## 2021-05-09 NOTE — Consult Note (Signed)
CODE SEPSIS - PHARMACY COMMUNICATION  **Broad Spectrum Antibiotics should be administered within 1 hour of Sepsis diagnosis**  Time Code Sepsis Called/Page Received: 0740  Antibiotics Ordered: Vancomycin/Ceftriaxone  Time of 1st antibiotic administration: 0806  Additional action taken by pharmacy: none  If necessary, Name of Provider/Nurse Contacted: n/a    Albina Billet ,PharmD Clinical Pharmacist  05/09/2021  8:03 AM

## 2021-05-09 NOTE — ED Notes (Signed)
Agbata MD made aware of pt's temp still rising after one hour post 1g tylenol.

## 2021-05-09 NOTE — Consult Note (Signed)
PHARMACY -  BRIEF ANTIBIOTIC NOTE   Pharmacy has received consult(s) for Vancomcyin from an ED provider.  The patient's profile has been reviewed for ht/wt/allergies/indication/available labs.    One time order(s) placed for Vancomycin 1000mg  x 1 (to be given following 1000mg  already ordered for a total loading dose of 2000mg )  Further antibiotics/pharmacy consults should be ordered by admitting physician if indicated.                       Thank you,  , PharmD, BCPS Clinical Pharmacist 05/09/2021 8:02 AM

## 2021-05-09 NOTE — ED Notes (Signed)
Blood glucose 394, Smith MD made aware.

## 2021-05-09 NOTE — Progress Notes (Signed)
Cooling blanket applied per MD order will continue to monitor.

## 2021-05-10 LAB — CBC
HCT: 32.5 % — ABNORMAL LOW (ref 36.0–46.0)
Hemoglobin: 10.7 g/dL — ABNORMAL LOW (ref 12.0–15.0)
MCH: 26.1 pg (ref 26.0–34.0)
MCHC: 32.9 g/dL (ref 30.0–36.0)
MCV: 79.3 fL — ABNORMAL LOW (ref 80.0–100.0)
Platelets: 190 10*3/uL (ref 150–400)
RBC: 4.1 MIL/uL (ref 3.87–5.11)
RDW: 13.2 % (ref 11.5–15.5)
WBC: 15.3 10*3/uL — ABNORMAL HIGH (ref 4.0–10.5)
nRBC: 0 % (ref 0.0–0.2)

## 2021-05-10 LAB — BASIC METABOLIC PANEL
Anion gap: 7 (ref 5–15)
BUN: 10 mg/dL (ref 6–20)
CO2: 24 mmol/L (ref 22–32)
Calcium: 8.5 mg/dL — ABNORMAL LOW (ref 8.9–10.3)
Chloride: 101 mmol/L (ref 98–111)
Creatinine, Ser: 0.62 mg/dL (ref 0.44–1.00)
GFR, Estimated: >60 mL/min (ref >60)
Glucose, Bld: 289 mg/dL — ABNORMAL HIGH (ref 70–99)
Potassium: 4 mmol/L (ref 3.5–5.1)
Sodium: 132 mmol/L — ABNORMAL LOW (ref 135–145)

## 2021-05-10 LAB — GLUCOSE, CAPILLARY
Glucose-Capillary: 297 mg/dL — ABNORMAL HIGH (ref 70–99)
Glucose-Capillary: 299 mg/dL — ABNORMAL HIGH (ref 70–99)
Glucose-Capillary: 271 mg/dL — ABNORMAL HIGH (ref 70–99)
Glucose-Capillary: 290 mg/dL — ABNORMAL HIGH (ref 70–99)

## 2021-05-10 MED ORDER — DOXYCYCLINE HYCLATE 100 MG PO TABS
100.0000 mg | ORAL_TABLET | Freq: Two times a day (BID) | ORAL | Status: DC
  Administered 2021-05-10 – 2021-05-13 (×7): 100 mg via ORAL
  Filled 2021-05-10 (×7): qty 1

## 2021-05-10 MED ORDER — NICOTINE 21 MG/24HR TD PT24
21.0000 mg | MEDICATED_PATCH | Freq: Every day | TRANSDERMAL | Status: DC
  Administered 2021-05-11 – 2021-05-17 (×7): 21 mg via TRANSDERMAL
  Filled 2021-05-10 (×7): qty 1

## 2021-05-10 MED ORDER — INSULIN DETEMIR 100 UNIT/ML ~~LOC~~ SOLN
15.0000 [IU] | Freq: Two times a day (BID) | SUBCUTANEOUS | Status: DC
  Administered 2021-05-10 – 2021-05-11 (×3): 15 [IU] via SUBCUTANEOUS
  Filled 2021-05-10 (×5): qty 0.15

## 2021-05-10 NOTE — Progress Notes (Addendum)
Inpatient Diabetes Program Recommendations  AACE/ADA: New Consensus Statement on Inpatient Glycemic Control (2015)  Target Ranges:  Prepandial:   less than 140 mg/dL      Peak postprandial:   less than 180 mg/dL (1-2 hours)      Critically ill patients:  140 - 180 mg/dL   Lab Results  Component Value Date   GLUCAP 299 (H) 05/10/2021   HGBA1C 8.2 (H) 05/09/2021    Review of Glycemic Control Results for Ashley Snow, Ashley Snow (MRN [id number]) as of 05/10/2021 11:45  Ref. Range 05/09/2021 07:34 05/10/2021 04:18  Glucose Latest Ref Range: 70 - 99 mg/dL 426 (H) 834 (H)  Results for [name], [name] (MRN [id number]) as of 05/10/2021 11:45  Ref. Range 05/09/2021 11:36 05/09/2021 16:30 05/09/2021 20:22 05/10/2021 08:10 05/10/2021 11:37  Glucose-Capillary Latest Ref Range: 70 - 99 mg/dL 892 (H) 119 (H) 417 (H) 297 (H) 299 (H)   Diabetes history: DM 2 Outpatient Diabetes medications:  Metformin 1000 mg bid Per visit with provider on 04/27/21, she was taking Novolin 70/30-30 units q AM as well? Current orders for Inpatient glycemic control:  Novolog resistant tid with meals and HS Inpatient Diabetes Program Recommendations:   Please consider adding Levemir 15 units bid while in the hospital. Called and discussed with patient by phone.  She states that she was taking Novolin 70/30- 30 units q PM.  She states it was easier to take in the evenings when she was living in the shelter.  Will follow.  Thanks, Beryl Meager, RN, BC-ADM Inpatient Diabetes Coordinator Pager [phone] (8a-5p)

## 2021-05-10 NOTE — Progress Notes (Signed)
   05/10/21 1621  Assess: MEWS Score  Temp (!) 102.1 F (38.9 C)  BP 138/87  Pulse Rate 94  Resp 17  Level of Consciousness Alert  SpO2 97 %  O2 Device Room Air  Assess: MEWS Score  MEWS Temp 2  MEWS Systolic 0  MEWS Pulse 0  MEWS RR 0  MEWS LOC 0  MEWS Score 2  MEWS Score Color Yellow  Assess: if the MEWS score is Yellow or Red  Were vital signs taken at a resting state? Yes  Focused Assessment No change from prior assessment  Early Detection of Sepsis Score *See Row Information* High  MEWS guidelines implemented *See Row Information* Yes  Treat  MEWS Interventions Administered prn meds/treatments  Pain Scale 0-10  Pain Score 6  Pain Type Acute pain  Pain Location Back  Pain Orientation Left;Upper  Pain Frequency Constant  Patients Stated Pain Goal 0  Pain Intervention(s) Medication (See eMAR)  Complains of Fever  Interventions Medication (see MAR)  Take Vital Signs  Increase Vital Sign Frequency  Yellow: Q 2hr X 2 then Q 4hr X 2, if remains yellow, continue Q 4hrs  Escalate  MEWS: Escalate Yellow: discuss with charge nurse/RN and consider discussing with provider and RRT  Notify: Charge Nurse/RN  Name of Charge Nurse/RN Notified Debi RN   Date Charge Nurse/RN Notified 05/10/21  Time Charge Nurse/RN Notified 1630  Notify: Provider  Provider Name/Title Dr Fran Lowes  Date Provider Notified 05/10/21  Time Provider Notified 1630  Notification Type  (text)  Notification Reason Other (Comment) (temperature)  Provider response No new orders  Date of Provider Response 05/10/21  Time of Provider Response 1645  Document  Patient Outcome Stabilized after interventions  Progress note created (see row info) Yes

## 2021-05-10 NOTE — Progress Notes (Signed)
PROGRESS NOTE    Ashley Snow  POE:[id number] DOB: 1971/06/11 DOA: 05/09/2021 PCP: Patient, No Pcp Per (Inactive)  106A/106A-AA   Assessment & Plan:   Principal Problem:   Cellulitis Active Problems:   Hypertension   Diabetes mellitus with hyperglycemia (HCC)   Nicotine dependence   Ashley Snow is a 50 y.o. female with medical history significant for insulin-dependent diabetes mellitus, obesity, hypertension, nicotine dependence who currently resides in a boarding house.  She presents to the ER for evaluation of pain, redness and swelling involving her left upper back. Patient states that since she moved into the boardinghouse she has had issues with bedbugs.  4 days prior to her admission she noticed a bump on her left upper back that was pruritic.  She popped the raised area and by the next day noticed some redness and swelling which has progressively worsened over the last couple of days.  She has also had chills and fever at home with a T-max of 101F in the emergency room.   Cellulitis of upper back, POA Sepsis, POA Patient presents for evaluation of pain, swelling, redness involving the left upper back following a bug bite. She had a fever with a T-max of 101 F, she was tachycardic and tachypneic with normal lactic acid level as well as leukocytosis --started on Ancef 1 g IV every 8 hours --fever up to 102 today, seems out of proportion to the presumed cellulitis, concern for tick-born illness --procal neg Plan: --cont IV Ancef for now --start doxycycline 100 mg BID --Ehrlichia, RMSF and Lyme antibody panel  Diabetes mellitus with hyperglycemia Secondary to medication noncompliance --Patient will need medication assistance to obtain her insulin prior to discharge --start Levemir 15u BID --SSI  Current smoker Smoking cessation was discussed with the patient in detail --nicotine patch  Obesity (BMI 33) Complicates overall prognosis and care Lifestyle modification  and exercise has been discussed with patient in detail  HLD --cont statin   DVT prophylaxis: Lovenox SQ Code Status: Full code  Family Communication:  Level of care: Med-Surg Dispo:   The patient is from: boarding house Anticipated d/c is to: boarding house Anticipated d/c date is: 2-3 days Patient currently is not medically ready to d/c due to: persistent fevers, unclear etiology   Subjective and Interval History:  Pt continued to have fevers.  Reported DOE.  Rash over her upper bad tender.   Objective: Vitals:   05/10/21 0459 05/10/21 0811 05/10/21 1138 05/10/21 1621  BP: (!) 152/84 (!) 145/87 115/72 138/87  Pulse: (!) 106 (!) 109 85 94  Resp: 16 18 16 17   Temp: 99.3 F (37.4 C) 100.2 F (37.9 C) 99.9 F (37.7 C) (!) 102.1 F (38.9 C)  TempSrc:    Oral  SpO2: 95% 97% 95% 97%  Weight:      Height:        Intake/Output Summary (Last 24 hours) at 05/10/2021 1706 Last data filed at 05/10/2021 1018 Gross per 24 hour  Intake 600 ml  Output --  Net 600 ml   Filed Weights   05/09/21 0730  Weight: 99.3 kg    Examination:   Constitutional: NAD, AAOx3 HEENT: conjunctivae and lids normal, EOMI CV: No cyanosis.   RESP: normal respiratory effort, on RA Extremities: No effusions, edema in BLE SKIN: warm, dry, raised area of erythema and edema tender to palpation, scattered round scabs on her body Neuro: II - XII grossly intact.   Psych: Normal mood and affect.  Appropriate judgement and reason  Prior to presentation     05/10/21    Data Reviewed: I have personally reviewed following labs and imaging studies  CBC: Recent Labs  Lab 05/09/21 0734 05/10/21 0418  WBC 16.5* 15.3*  NEUTROABS 13.7*  --   HGB 12.1 10.7*  HCT 37.3 32.5*  MCV 80.9 79.3*  PLT 248 190   Basic Metabolic Panel: Recent Labs  Lab 05/09/21 0734 05/10/21 0418  NA 134* 132*  K 4.1 4.0  CL 100 101  CO2 22 24  GLUCOSE 404* 289*  BUN 18 10  CREATININE 0.71 0.62  CALCIUM 9.7  8.5*  MG 1.8  --    GFR: Estimated Creatinine Clearance: 102.6 mL/min (by C-G formula based on SCr of 0.62 mg/dL). Liver Function Tests: Recent Labs  Lab 05/09/21 0734  AST 12*  ALT 11  ALKPHOS 99  BILITOT 0.3  PROT 7.9  ALBUMIN 4.0   No results for input(s): LIPASE, AMYLASE in the last 168 hours. No results for input(s): AMMONIA in the last 168 hours. Coagulation Profile: Recent Labs  Lab 05/09/21 0734  INR 0.9   Cardiac Enzymes: No results for input(s): CKTOTAL, CKMB, CKMBINDEX, TROPONINI in the last 168 hours. BNP (last 3 results) No results for input(s): PROBNP in the last 8760 hours. HbA1C: Recent Labs    05/09/21 0734  HGBA1C 8.2*   CBG: Recent Labs  Lab 05/09/21 1630 05/09/21 2022 05/10/21 0810 05/10/21 1137 05/10/21 1633  GLUCAP 297* 339* 297* 299* 271*   Lipid Profile: No results for input(s): CHOL, HDL, LDLCALC, TRIG, CHOLHDL, LDLDIRECT in the last 72 hours. Thyroid Function Tests: No results for input(s): TSH, T4TOTAL, FREET4, T3FREE, THYROIDAB in the last 72 hours. Anemia Panel: No results for input(s): VITAMINB12, FOLATE, FERRITIN, TIBC, IRON, RETICCTPCT in the last 72 hours. Sepsis Labs: Recent Labs  Lab 05/09/21 0734 05/09/21 0931  PROCALCITON <0.10  --   LATICACIDVEN 1.4 1.5    Recent Results (from the past 240 hour(s))  Blood culture (routine single)     Status: None (Preliminary result)   Collection Time: 05/09/21  7:34 AM   Specimen: BLOOD RIGHT HAND  Result Value Ref Range Status   Specimen Description BLOOD RIGHT HAND  Final   Special Requests   Final    BOTTLES DRAWN AEROBIC AND ANAEROBIC Blood Culture results may not be optimal due to an inadequate volume of blood received in culture bottles   Culture   Final    NO GROWTH < 24 HOURS Performed at [facility], [address] [place], [place] [address]    Report Status PENDING  Incomplete  Resp Panel by RT-PCR (Flu A&B, Covid) Nasopharyngeal Swab     Status: None    Collection Time: 05/09/21  7:58 AM   Specimen: Nasopharyngeal Swab; Nasopharyngeal(NP) swabs in vial transport medium  Result Value Ref Range Status   SARS Coronavirus 2 by RT PCR NEGATIVE NEGATIVE Final    Comment: (NOTE) SARS-CoV-2 target nucleic acids are NOT DETECTED.  The SARS-CoV-2 RNA is generally detectable in upper respiratory specimens during the acute phase of infection. The lowest concentration of SARS-CoV-2 viral copies this assay can detect is 138 copies/mL. A negative result does not preclude SARS-Cov-2 infection and should not be used as the sole basis for treatment or other patient management decisions. A negative result may occur with  improper specimen collection/handling, submission of specimen other than nasopharyngeal swab, presence of viral mutation(s) within the areas targeted by this assay, and inadequate number of viral copies(<138  copies/mL). A negative result must be combined with clinical observations, patient history, and epidemiological information. The expected result is Negative.  Fact Sheet for Patients:  BloggerCourse.comhttps://www.fda.gov/media/152166/download  Fact Sheet for Healthcare Providers:  SeriousBroker.ithttps://www.fda.gov/media/152162/download  This test is no t yet approved or cleared by the [place] FDA and  has been authorized for detection and/or diagnosis of SARS-CoV-2 by FDA under an Emergency Use Authorization (EUA). This EUA will remain  in effect (meaning this test can be used) for the duration of the COVID-19 declaration under Section 564(b)(1) of the Act, 21 U.S.C.section 360bbb-3(b)(1), unless the authorization is terminated  or revoked sooner.       Influenza A by PCR NEGATIVE NEGATIVE Final   Influenza B by PCR NEGATIVE NEGATIVE Final    Comment: (NOTE) The Xpert Xpress SARS-CoV-2/FLU/RSV plus assay is intended as an aid in the diagnosis of influenza from Nasopharyngeal swab specimens and should not be used as a sole basis for treatment.  Nasal washings and aspirates are unacceptable for Xpert Xpress SARS-CoV-2/FLU/RSV testing.  Fact Sheet for Patients: BloggerCourse.comhttps://www.fda.gov/media/152166/download  Fact Sheet for Healthcare Providers: SeriousBroker.ithttps://www.fda.gov/media/152162/download  This test is not yet approved or cleared by the [place] FDA and has been authorized for detection and/or diagnosis of SARS-CoV-2 by FDA under an Emergency Use Authorization (EUA). This EUA will remain in effect (meaning this test can be used) for the duration of the COVID-19 declaration under Section 564(b)(1) of the Act, 21 U.S.C. section 360bbb-3(b)(1), unless the authorization is terminated or revoked.  Performed at [facility], [address] [place], [place] [address]   MRSA PCR Screening     Status: Abnormal   Collection Time: 05/09/21 12:07 PM   Specimen: Nasopharyngeal  Result Value Ref Range Status   MRSA by PCR POSITIVE (A) NEGATIVE Final    Comment:        The GeneXpert MRSA Assay (FDA approved for NASAL specimens only), is one component of a comprehensive MRSA colonization surveillance program. It is not intended to diagnose MRSA infection nor to guide or monitor treatment for MRSA infections. RESULT CALLED TO, READ BACK BY AND VERIFIED WITH: A.RAMIREZ,RN AT 1442 ON 05/09/21 BY GM Performed at [facility], [address] [place], [place] [address]       Radiology Studies: US Venous Img Lower Bilateral  Result Date: 05/09/2021 CLINICAL DATA:  Elevated D-dimer. EXAM: BILATERAL LOWER EXTREMITY VENOUS DOPPLER ULTRASOUND TECHNIQUE: Gray-scale sonography with compression, as well as color and duplex ultrasound, were performed to evaluate the deep venous system(s) from the level of the common femoral vein through the popliteal and proximal calf veins. COMPARISON:  None FINDINGS: VENOUS Normal compressibility of the common femoral, superficial femoral, and popliteal veins, as well as the visualized calf  veins. Visualized portions of profunda femoral vein and great saphenous vein unremarkable. No filling defects to suggest DVT on grayscale or color Doppler imaging. Doppler waveforms show normal direction of venous flow, normal respiratory plasticity and response to augmentation. Limited views of the contralateral common femoral vein are unremarkable. OTHER None. Limitations: none IMPRESSION: Negative. Electronically Signed   By: Norva PavlovElizabeth  Brown M.D.   On: 05/09/2021 10:35   DG Chest Port 1 View  Result Date: 05/09/2021 CLINICAL DATA:  Sepsis.  Shortness of breath and nausea. EXAM: PORTABLE CHEST 1 VIEW COMPARISON:  04/24/2015 FINDINGS: The heart size and mediastinal contours are within normal limits. Both lungs are clear. The visualized skeletal structures are unremarkable. IMPRESSION: No active disease. Electronically Signed   By: Ladona Ridgelaylor  Bradly Chris M.D.   On: 05/09/2021 07:54     Scheduled Meds: . atorvastatin  80 mg Oral Daily  .  ceFAZolin (ANCEF) IV  1 g Intravenous Q8H  . Chlorhexidine Gluconate Cloth  6 each Topical Q0600  . doxycycline  100 mg Oral Q12H  . enoxaparin (LOVENOX) injection  0.5 mg/kg Subcutaneous Q24H  . gabapentin  100 mg Oral QHS  . insulin aspart  0-20 Units Subcutaneous TID WC  . insulin aspart  0-5 Units Subcutaneous QHS  . loratadine  10 mg Oral Daily  . mupirocin ointment  1 application Nasal BID  . nicotine  14 mg Transdermal Daily  . senna  1 tablet Oral BID   Continuous Infusions:   LOS: 1 day     Darlin Priestly, MD [facility] If 7PM-7AM, please contact night-coverage 05/10/2021, 5:06 PM

## 2021-05-11 ENCOUNTER — Ambulatory Visit: Payer: Self-pay | Admitting: Gerontology

## 2021-05-11 LAB — CBC
HCT: 32.3 % — ABNORMAL LOW (ref 36.0–46.0)
Hemoglobin: 10.6 g/dL — ABNORMAL LOW (ref 12.0–15.0)
MCH: 26.1 pg (ref 26.0–34.0)
MCHC: 32.8 g/dL (ref 30.0–36.0)
MCV: 79.6 fL — ABNORMAL LOW (ref 80.0–100.0)
Platelets: 189 10*3/uL (ref 150–400)
RBC: 4.06 MIL/uL (ref 3.87–5.11)
RDW: 13 % (ref 11.5–15.5)
WBC: 11.1 10*3/uL — ABNORMAL HIGH (ref 4.0–10.5)
nRBC: 0 % (ref 0.0–0.2)

## 2021-05-11 LAB — GLUCOSE, CAPILLARY
Glucose-Capillary: 202 mg/dL — ABNORMAL HIGH (ref 70–99)
Glucose-Capillary: 336 mg/dL — ABNORMAL HIGH (ref 70–99)
Glucose-Capillary: 234 mg/dL — ABNORMAL HIGH (ref 70–99)
Glucose-Capillary: 306 mg/dL — ABNORMAL HIGH (ref 70–99)

## 2021-05-11 LAB — BASIC METABOLIC PANEL
Anion gap: 7 (ref 5–15)
BUN: 12 mg/dL (ref 6–20)
CO2: 26 mmol/L (ref 22–32)
Calcium: 8.7 mg/dL — ABNORMAL LOW (ref 8.9–10.3)
Chloride: 100 mmol/L (ref 98–111)
Creatinine, Ser: 0.68 mg/dL (ref 0.44–1.00)
GFR, Estimated: >60 mL/min (ref >60)
Glucose, Bld: 306 mg/dL — ABNORMAL HIGH (ref 70–99)
Potassium: 3.9 mmol/L (ref 3.5–5.1)
Sodium: 133 mmol/L — ABNORMAL LOW (ref 135–145)

## 2021-05-11 LAB — MAGNESIUM: Magnesium: 1.7 mg/dL (ref 1.7–2.4)

## 2021-05-11 MED ORDER — POLYETHYLENE GLYCOL 3350 17 G PO PACK
34.0000 g | PACK | ORAL | Status: AC
  Administered 2021-05-11 (×3): 34 g via ORAL
  Filled 2021-05-11 (×3): qty 2

## 2021-05-11 MED ORDER — VANCOMYCIN HCL 2000 MG/400ML IV SOLN
2000.0000 mg | Freq: Once | INTRAVENOUS | Status: AC
  Administered 2021-05-11: 2000 mg via INTRAVENOUS
  Filled 2021-05-11: qty 400

## 2021-05-11 MED ORDER — OXYCODONE HCL 5 MG PO TABS
5.0000 mg | ORAL_TABLET | ORAL | Status: AC | PRN
  Administered 2021-05-11 – 2021-05-12 (×2): 5 mg via ORAL
  Filled 2021-05-11 (×2): qty 1

## 2021-05-11 MED ORDER — GABAPENTIN 300 MG PO CAPS
300.0000 mg | ORAL_CAPSULE | Freq: Three times a day (TID) | ORAL | Status: DC
  Administered 2021-05-11 – 2021-05-17 (×18): 300 mg via ORAL
  Filled 2021-05-11 (×18): qty 1

## 2021-05-11 MED ORDER — VANCOMYCIN HCL 750 MG/150ML IV SOLN
750.0000 mg | Freq: Two times a day (BID) | INTRAVENOUS | Status: DC
  Administered 2021-05-12 – 2021-05-14 (×5): 750 mg via INTRAVENOUS
  Filled 2021-05-11 (×6): qty 150

## 2021-05-11 MED ORDER — IBUPROFEN 400 MG PO TABS
600.0000 mg | ORAL_TABLET | Freq: Four times a day (QID) | ORAL | Status: DC | PRN
  Administered 2021-05-12: 600 mg via ORAL
  Filled 2021-05-11: qty 2

## 2021-05-11 NOTE — Consult Note (Signed)
Infectious Disease     Reason for Consult:Fever    Referring Physician: Dr Billie Ruddy Date of Admission:  05/09/2021   Principal Problem:   Cellulitis Active Problems:   Hypertension   Diabetes mellitus with hyperglycemia ([place])   Nicotine dependence   HPI: Ashley Snow is a 50 y.o. female with history significant for insulin-dependent diabetes mellitus, obesity, hypertension, nicotine dependence who currently resides in a boarding house.  She presents to the ER for evaluation of pain, redness and swelling involving her left upper back. Admitted 5/15 at which time WBC was 16. Febrile to 101. Started on vanco x 1 then changed to cefazolin and doxy added 5/16 due to concern for tick borne illness. Remains febrile. She continues to have pain and swelling in back   Past Medical History:  Diagnosis Date  . Diabetes mellitus without complication ([place])   . Hyperlipidemia   . Hypertension   . Seasonal allergies    Past Surgical History:  Procedure Laterality Date  . CESAREAN SECTION    . KIDNEY STONE SURGERY    . TUBAL LIGATION     Social History   Tobacco Use  . Smoking status: Current Every Day Smoker    Packs/day: 0.50    Types: Cigarettes  . Smokeless tobacco: Never Used  Vaping Use  . Vaping Use: Never used  Substance Use Topics  . Alcohol use: Yes    Comment: social  . Drug use: Not Currently    Types: "Crack" cocaine    Comment: last use 09/2020   Family History  Problem Relation Age of Onset  . Dementia Mother   . Hypotension Mother   . Diabetes Mother   . Alcoholism Father   . Hypertension Father   . Hyperlipidemia Father   . Dementia Maternal Grandmother   . Alzheimer's disease Maternal Grandmother     Allergies:  Allergies  Allergen Reactions  . Iodine Rash    Current antibiotics: Antibiotics Given (last 72 hours)    Date/Time Action Medication Dose Rate   05/09/21 0806 New Bag/Given   cefTRIAXone (ROCEPHIN) 2 g in sodium chloride 0.9 % 100 mL IVPB 2 g  200 mL/hr   05/09/21 0811 New Bag/Given   vancomycin (VANCOCIN) IVPB 1000 mg/200 mL premix 1,000 mg 200 mL/hr   05/09/21 0912 New Bag/Given   vancomycin (VANCOCIN) IVPB 1000 mg/200 mL premix 1,000 mg 200 mL/hr   05/09/21 1447 Given   ceFAZolin (ANCEF) 1 g in sodium chloride 0.9 % 100 mL IVPB 1 g 200 mL/hr   05/09/21 2133 Given   ceFAZolin (ANCEF) 1 g in sodium chloride 0.9 % 100 mL IVPB 1 g 200 mL/hr   05/10/21 0616 Given   ceFAZolin (ANCEF) 1 g in sodium chloride 0.9 % 100 mL IVPB 1 g 200 mL/hr   05/10/21 1225 Given   ceFAZolin (ANCEF) 1 g in sodium chloride 0.9 % 100 mL IVPB 1 g 200 mL/hr   05/10/21 1717 Given   doxycycline (VIBRA-TABS) tablet 100 mg 100 mg    05/10/21 2141 Given   ceFAZolin (ANCEF) 1 g in sodium chloride 0.9 % 100 mL IVPB 1 g 200 mL/hr   05/10/21 2142 Given   doxycycline (VIBRA-TABS) tablet 100 mg 100 mg    05/11/21 0531 Given   ceFAZolin (ANCEF) 1 g in sodium chloride 0.9 % 100 mL IVPB 1 g 200 mL/hr   05/11/21 0834 Given   doxycycline (VIBRA-TABS) tablet 100 mg 100 mg    05/11/21 1610 Given  ceFAZolin (ANCEF) 1 g in sodium chloride 0.9 % 100 mL IVPB 1 g 200 mL/hr      MEDICATIONS: . atorvastatin  80 mg Oral Daily  . Chlorhexidine Gluconate Cloth  6 each Topical Q0600  . doxycycline  100 mg Oral Q12H  . enoxaparin (LOVENOX) injection  0.5 mg/kg Subcutaneous Q24H  . gabapentin  300 mg Oral TID  . insulin aspart  0-20 Units Subcutaneous TID WC  . insulin aspart  0-5 Units Subcutaneous QHS  . insulin detemir  15 Units Subcutaneous BID  . loratadine  10 mg Oral Daily  . mupirocin ointment  1 application Nasal BID  . nicotine  21 mg Transdermal Daily  . polyethylene glycol  34 g Oral Q2H  . senna  1 tablet Oral BID    Review of Systems - 11 systems reviewed and negative per HPI   OBJECTIVE: Temp:  [98.1 F (36.7 C)-101.7 F (38.7 C)] 101.1 F (38.4 C) (05/17 1545) Pulse Rate:  [82-107] 100 (05/17 1545) Resp:  [16-20] 17 (05/17 1545) BP:  (110-148)/(73-93) 148/93 (05/17 1545) SpO2:  [94 %-98 %] 96 % (05/17 1545)  Physical Exam  Constitutional:  oriented to person, place, and time. appears well-developed. Obese, NAD HENT: [place]/AT, PERRLA, no scleral icterus Mouth/Throat: Oropharynx is clear and moist. No oropharyngeal exudate.  Cardiovascular: Normal rate, regular rhythm and normal heart sounds. Exam reveals no gallop and no friction rub.  No murmur heard.  Pulmonary/Chest: Effort normal and breath sounds normal. No respiratory distress.  has no wheezes.  Neck = supple, no nuchal rigidity Abdominal: Soft. Bowel sounds are normal.  exhibits no distension. There is no tenderness.  Lymphadenopathy: no cervical adenopathy. No axillary adenopathy Neurological: alert and oriented to person, place, and time.  Skin: mid back with large indurated area with drainage Psychiatric: a normal mood and affect.  behavior is normal.       LABS: Results for orders placed or performed during the hospital encounter of 05/09/21 (from the past 48 hour(s))  Glucose, capillary     Status: Abnormal   Collection Time: 05/09/21  8:22 PM  Result Value Ref Range   Glucose-Capillary 339 (H) 70 - 99 mg/dL    Comment: Glucose reference range applies only to samples taken after fasting for at least 8 hours.  Basic metabolic panel     Status: Abnormal   Collection Time: 05/10/21  4:18 AM  Result Value Ref Range   Sodium 132 (L) 135 - 145 mmol/L   Potassium 4.0 3.5 - 5.1 mmol/L   Chloride 101 98 - 111 mmol/L   CO2 24 22 - 32 mmol/L   Glucose, Bld 289 (H) 70 - 99 mg/dL    Comment: Glucose reference range applies only to samples taken after fasting for at least 8 hours.   BUN 10 6 - 20 mg/dL   Creatinine, Ser 0.62 0.44 - 1.00 mg/dL   Calcium 8.5 (L) 8.9 - 10.3 mg/dL   GFR, Estimated >60 >60 mL/min    Comment: (NOTE) Calculated using the CKD-EPI Creatinine Equation (2021)    Anion gap 7 5 - 15    Comment: Performed at [facility], [address] [place], [place] [address]  CBC     Status: Abnormal   Collection Time: 05/10/21  4:18 AM  Result Value Ref Range   WBC 15.3 (H) 4.0 - 10.5 K/uL   RBC 4.10 3.87 - 5.11 MIL/uL   Hemoglobin 10.7 (L) 12.0 - 15.0 g/dL  HCT 32.5 (L) 36.0 - 46.0 %   MCV 79.3 (L) 80.0 - 100.0 fL   MCH 26.1 26.0 - 34.0 pg   MCHC 32.9 30.0 - 36.0 g/dL   RDW 13.2 11.5 - 15.5 %   Platelets 190 150 - 400 K/uL   nRBC 0.0 0.0 - 0.2 %    Comment: Performed at [facility], [address] [place], [place] [address]  Glucose, capillary     Status: Abnormal   Collection Time: 05/10/21  8:10 AM  Result Value Ref Range   Glucose-Capillary 297 (H) 70 - 99 mg/dL    Comment: Glucose reference range applies only to samples taken after fasting for at least 8 hours.  Glucose, capillary     Status: Abnormal   Collection Time: 05/10/21 11:37 AM  Result Value Ref Range   Glucose-Capillary 299 (H) 70 - 99 mg/dL    Comment: Glucose reference range applies only to samples taken after fasting for at least 8 hours.  Glucose, capillary     Status: Abnormal   Collection Time: 05/10/21  4:33 PM  Result Value Ref Range   Glucose-Capillary 271 (H) 70 - 99 mg/dL    Comment: Glucose reference range applies only to samples taken after fasting for at least 8 hours.  Glucose, capillary     Status: Abnormal   Collection Time: 05/10/21  9:30 PM  Result Value Ref Range   Glucose-Capillary 290 (H) 70 - 99 mg/dL    Comment: Glucose reference range applies only to samples taken after fasting for at least 8 hours.  Basic metabolic panel     Status: Abnormal   Collection Time: 05/11/21  4:39 AM  Result Value Ref Range   Sodium 133 (L) 135 - 145 mmol/L   Potassium 3.9 3.5 - 5.1 mmol/L   Chloride 100 98 - 111 mmol/L   CO2 26 22 - 32 mmol/L   Glucose, Bld 306 (H) 70 - 99 mg/dL    Comment: Glucose reference range applies only to samples taken after fasting for at least 8 hours.   BUN 12 6 - 20 mg/dL   Creatinine, Ser  0.68 0.44 - 1.00 mg/dL   Calcium 8.7 (L) 8.9 - 10.3 mg/dL   GFR, Estimated >60 >60 mL/min    Comment: (NOTE) Calculated using the CKD-EPI Creatinine Equation (2021)    Anion gap 7 5 - 15    Comment: Performed at [facility], [address] [place], [place] [address]  CBC     Status: Abnormal   Collection Time: 05/11/21  4:39 AM  Result Value Ref Range   WBC 11.1 (H) 4.0 - 10.5 K/uL   RBC 4.06 3.87 - 5.11 MIL/uL   Hemoglobin 10.6 (L) 12.0 - 15.0 g/dL   HCT 32.3 (L) 36.0 - 46.0 %   MCV 79.6 (L) 80.0 - 100.0 fL   MCH 26.1 26.0 - 34.0 pg   MCHC 32.8 30.0 - 36.0 g/dL   RDW 13.0 11.5 - 15.5 %   Platelets 189 150 - 400 K/uL   nRBC 0.0 0.0 - 0.2 %    Comment: Performed at [facility], [address], [place], [place] [address]  Magnesium     Status: None   Collection Time: 05/11/21  4:39 AM  Result Value Ref Range   Magnesium 1.7 1.7 - 2.4 mg/dL    Comment: Performed at [facility], [address] [place], [place] [address]  Glucose, capillary  Status: Abnormal   Collection Time: 05/11/21  7:47 AM  Result Value Ref Range   Glucose-Capillary 202 (H) 70 - 99 mg/dL    Comment: Glucose reference range applies only to samples taken after fasting for at least 8 hours.  Glucose, capillary     Status: Abnormal   Collection Time: 05/11/21 11:54 AM  Result Value Ref Range   Glucose-Capillary 336 (H) 70 - 99 mg/dL    Comment: Glucose reference range applies only to samples taken after fasting for at least 8 hours.  Glucose, capillary     Status: Abnormal   Collection Time: 05/11/21  4:19 PM  Result Value Ref Range   Glucose-Capillary 234 (H) 70 - 99 mg/dL    Comment: Glucose reference range applies only to samples taken after fasting for at least 8 hours.   No components found for: ESR, C REACTIVE PROTEIN MICRO: Recent Results (from the past 720 hour(s))  Blood culture (routine single)     Status: None (Preliminary result)   Collection Time: 05/09/21   7:34 AM   Specimen: BLOOD RIGHT HAND  Result Value Ref Range Status   Specimen Description BLOOD RIGHT HAND  Final   Special Requests   Final    BOTTLES DRAWN AEROBIC AND ANAEROBIC Blood Culture results may not be optimal due to an inadequate volume of blood received in culture bottles   Culture   Final    NO GROWTH 2 DAYS Performed at [facility], [address] [place], [place] [address]    Report Status PENDING  Incomplete  Resp Panel by RT-PCR (Flu A&B, Covid) Nasopharyngeal Swab     Status: None   Collection Time: 05/09/21  7:58 AM   Specimen: Nasopharyngeal Swab; Nasopharyngeal(NP) swabs in vial transport medium  Result Value Ref Range Status   SARS Coronavirus 2 by RT PCR NEGATIVE NEGATIVE Final    Comment: (NOTE) SARS-CoV-2 target nucleic acids are NOT DETECTED.  The SARS-CoV-2 RNA is generally detectable in upper respiratory specimens during the acute phase of infection. The lowest concentration of SARS-CoV-2 viral copies this assay can detect is 138 copies/mL. A negative result does not preclude SARS-Cov-2 infection and should not be used as the sole basis for treatment or other patient management decisions. A negative result may occur with  improper specimen collection/handling, submission of specimen other than nasopharyngeal swab, presence of viral mutation(s) within the areas targeted by this assay, and inadequate number of viral copies(<138 copies/mL). A negative result must be combined with clinical observations, patient history, and epidemiological information. The expected result is Negative.  Fact Sheet for Patients:  EntrepreneurPulse.com.au  Fact Sheet for Healthcare Providers:  IncredibleEmployment.be  This test is no t yet approved or cleared by the [place] FDA and  has been authorized for detection and/or diagnosis of SARS-CoV-2 by FDA under an Emergency Use Authorization (EUA). This EUA will remain   in effect (meaning this test can be used) for the duration of the COVID-19 declaration under Section 564(b)(1) of the Act, 21 U.S.C.section 360bbb-3(b)(1), unless the authorization is terminated  or revoked sooner.       Influenza A by PCR NEGATIVE NEGATIVE Final   Influenza B by PCR NEGATIVE NEGATIVE Final    Comment: (NOTE) The Xpert Xpress SARS-CoV-2/FLU/RSV plus assay is intended as an aid in the diagnosis of influenza from Nasopharyngeal swab specimens and should not be used as a sole basis for treatment. Nasal washings and aspirates are unacceptable for Xpert Xpress SARS-CoV-2/FLU/RSV testing.  Fact Sheet for Patients: EntrepreneurPulse.com.au  Fact Sheet for Healthcare Providers: IncredibleEmployment.be  This test is not yet approved or cleared by the [place] FDA and has been authorized for detection and/or diagnosis of SARS-CoV-2 by FDA under an Emergency Use Authorization (EUA). This EUA will remain in effect (meaning this test can be used) for the duration of the COVID-19 declaration under Section 564(b)(1) of the Act, 21 U.S.C. section 360bbb-3(b)(1), unless the authorization is terminated or revoked.  Performed at [facility], [address] [place], [place] [address]   MRSA PCR Screening     Status: Abnormal   Collection Time: 05/09/21 12:07 PM   Specimen: Nasopharyngeal  Result Value Ref Range Status   MRSA by PCR POSITIVE (A) NEGATIVE Final    Comment:        The GeneXpert MRSA Assay (FDA approved for NASAL specimens only), is one component of a comprehensive MRSA colonization surveillance program. It is not intended to diagnose MRSA infection nor to guide or monitor treatment for MRSA infections. RESULT CALLED TO, READ BACK BY AND VERIFIED WITH: A.RAMIREZ,RN AT [id number] ON 05/09/21 BY GM Performed at [facility], [address] [place], [place] [address]     IMAGING: US Venous Img  Lower Bilateral  Result Date: 05/09/2021 CLINICAL DATA:  Elevated D-dimer. EXAM: BILATERAL LOWER EXTREMITY VENOUS DOPPLER ULTRASOUND TECHNIQUE: Gray-scale sonography with compression, as well as color and duplex ultrasound, were performed to evaluate the deep venous system(s) from the level of the common femoral vein through the popliteal and proximal calf veins. COMPARISON:  None FINDINGS: VENOUS Normal compressibility of the common femoral, superficial femoral, and popliteal veins, as well as the visualized calf veins. Visualized portions of profunda femoral vein and great saphenous vein unremarkable. No filling defects to suggest DVT on grayscale or color Doppler imaging. Doppler waveforms show normal direction of venous flow, normal respiratory plasticity and response to augmentation. Limited views of the contralateral common femoral vein are unremarkable. OTHER None. Limitations: none IMPRESSION: Negative. Electronically Signed   By: Nolon Nations M.D.   On: 05/09/2021 10:35   DG Chest Port 1 View  Result Date: 05/09/2021 CLINICAL DATA:  Sepsis.  Shortness of breath and nausea. EXAM: PORTABLE CHEST 1 VIEW COMPARISON:  04/24/2015 FINDINGS: The heart size and mediastinal contours are within normal limits. Both lungs are clear. The visualized skeletal structures are unremarkable. IMPRESSION: No active disease. Electronically Signed   By: Kerby Moors M.D.   On: 05/09/2021 07:54    Assessment:   Kersti Danzer is a 50 y.o. female with DM, obesity admitted with pain and redness on back. Has abscess at this point, now draining.   Recommendations Culture done. Start vanco. Will reassess tomorrow. May need I and D formally by Surgery and packing if it does not open more with warm compresses.  Thank you very much for allowing me to participate in the care of this patient. Please call with questions.   Cheral Marker. Ola Spurr, MD

## 2021-05-11 NOTE — Progress Notes (Signed)
   05/11/21 0051  Assess: MEWS Score  Temp (!) 101.7 F (38.7 C)  BP 135/73  Pulse Rate (!) 107  Resp 16  SpO2 94 %  O2 Device Room Air  Assess: MEWS Score  MEWS Temp 2  MEWS Systolic 0  MEWS Pulse 1  MEWS RR 0  MEWS LOC 0  MEWS Score 3  MEWS Score Color Yellow  Assess: if the MEWS score is Yellow or Red  Were vital signs taken at a resting state? Yes  Focused Assessment Change from prior assessment (see assessment flowsheet)  Early Detection of Sepsis Score *See Row Information* High  MEWS guidelines implemented *See Row Information* Yes  Treat  MEWS Interventions Administered prn meds/treatments  Take Vital Signs  Increase Vital Sign Frequency  Yellow: Q 2hr X 2 then Q 4hr X 2, if remains yellow, continue Q 4hrs  Escalate  MEWS: Escalate Yellow: discuss with charge nurse/RN and consider discussing with provider and RRT  Notify: Charge Nurse/RN  Name of Charge Nurse/RN Notified Izora Gala, RN  Date Charge Nurse/RN Notified 05/11/21  Time Charge Nurse/RN Notified 0054  Pt has been going between green and yellow MEWS since admission for increased temperature and HR. PRN tylenol administered.

## 2021-05-11 NOTE — Progress Notes (Signed)
   05/10/21 1621  Assess: MEWS Score  Temp (!) 102.1 F (38.9 C)  BP 138/87  Pulse Rate 94  Resp 17  Level of Consciousness Alert  SpO2 97 %  O2 Device Room Air  Assess: MEWS Score  MEWS Temp 2  MEWS Systolic 0  MEWS Pulse 0  MEWS RR 0  MEWS LOC 0  MEWS Score 2  MEWS Score Color Yellow  Assess: if the MEWS score is Yellow or Red  Were vital signs taken at a resting state? Yes  Focused Assessment No change from prior assessment  Early Detection of Sepsis Score *See Row Information* High  MEWS guidelines implemented *See Row Information* Yes  Treat  MEWS Interventions Administered prn meds/treatments  Pain Scale 0-10  Pain Score 6  Pain Type Acute pain  Pain Location Back  Pain Orientation Left;Upper  Pain Frequency Constant  Patients Stated Pain Goal 0  Pain Intervention(s) Medication (See eMAR)  Complains of Fever  Interventions Medication (see MAR)  Take Vital Signs  Increase Vital Sign Frequency  Yellow: Q 2hr X 2 then Q 4hr X 2, if remains yellow, continue Q 4hrs  Escalate  MEWS: Escalate Yellow: discuss with charge nurse/RN and consider discussing with provider and RRT  Notify: Charge Nurse/RN  Name of Charge Nurse/RN Notified Debi RN   Date Charge Nurse/RN Notified 05/10/21  Time Charge Nurse/RN Notified 1630  Notify: Provider  Provider Name/Title Dr Fran Lowes  Date Provider Notified 05/10/21  Time Provider Notified 1630  Notification Type  (text)  Notification Reason Other (Comment) (temperature)  Provider response No new orders  Date of Provider Response 05/10/21  Time of Provider Response 1645  Document  Patient Outcome Stabilized after interventions  Progress note created (see row info) Yes  Inserted for Curtis Sites RN

## 2021-05-11 NOTE — Progress Notes (Signed)
PROGRESS NOTE    Ashley Snow  YNW:[id number] DOB: 1971-11-22 DOA: 05/09/2021 PCP: Patient, No Pcp Per (Inactive)  106A/106A-AA   Assessment & Plan:   Principal Problem:   Cellulitis Active Problems:   Hypertension   Diabetes mellitus with hyperglycemia (HCC)   Nicotine dependence   Ashley Snow is a 50 y.o. female with medical history significant for insulin-dependent diabetes mellitus, obesity, hypertension, nicotine dependence who currently resides in a boarding house.  She presents to the ER for evaluation of pain, redness and swelling involving her left upper back. Patient states that since she moved into the boardinghouse she has had issues with bedbugs.  4 days prior to her admission she noticed a bump on her left upper back that was pruritic.  She popped the raised area and by the next day noticed some redness and swelling which has progressively worsened over the last couple of days.  She has also had chills and fever at home with a T-max of 101F in the emergency room.   Cellulitis of upper back, POA Sepsis, POA Patient presents for evaluation of pain, swelling, redness involving the left upper back following a bug bite. She had a fever with a T-max of 101 F, she was tachycardic and tachypneic with normal lactic acid level as well as leukocytosis --started on Ancef 1 g IV every 8 hours --fever up to 102, persistent, seems out of proportion to the presumed cellulitis, concern for tick-born illness, started on doxycycline. --procal neg Plan: --cont IV Ancef for now --cont doxycycline --f/u Ehrlichia, RMSF and Lyme antibody panel --ID consult today  Diabetes mellitus with hyperglycemia Secondary to medication noncompliance --Patient will need medication assistance to obtain her insulin prior to discharge --cont Levemir 15u BID --SSI  Peripheral neuropathy --pt was recently started on gabapentin.  Currently experiencing symptoms. --increase gabapentin to 300 mg  TID  Current smoker Smoking cessation was discussed with the patient in detail --nicotine patch  Obesity (BMI 33) Complicates overall prognosis and care Lifestyle modification and exercise has been discussed with patient in detail  HLD --cont statin   DVT prophylaxis: Lovenox SQ Code Status: Full code  Family Communication:  Level of care: Med-Surg Dispo:   The patient is from: boarding house Anticipated d/c is to: boarding house Anticipated d/c date is: 2-3 days Patient currently is not medically ready to d/c due to: persistent fevers, unclear etiology, pending ID consult   Subjective and Interval History:  Pt continued to have fevers.  The scab over back rash lifted and small amount of purulent fluid drained.   Objective: Vitals:   05/11/21 0743 05/11/21 1156 05/11/21 1545 05/11/21 2018  BP: (!) 140/93 122/83 (!) 148/93 (!) 147/90  Pulse: 92 100 100 89  Resp: 17 17 17 18   Temp: 99.2 F (37.3 C) 99.5 F (37.5 C) (!) 101.1 F (38.4 C) (!) 100.6 F (38.1 C)  TempSrc: Oral Oral Oral Oral  SpO2: 98% 96% 96% 100%  Weight:      Height:        Intake/Output Summary (Last 24 hours) at 05/11/2021 2036 Last data filed at 05/11/2021 1854 Gross per 24 hour  Intake 600 ml  Output --  Net 600 ml   Filed Weights   05/09/21 0730  Weight: 99.3 kg    Examination:   Constitutional: NAD, AAOx3 HEENT: conjunctivae and lids normal, EOMI CV: No cyanosis.   RESP: normal respiratory effort, on RA Extremities: No effusions, edema in BLE SKIN: warm, dry, raised area of  erythema and edema tender to palpation, with scab missing and small amount of drainage present.  Scatter round scabs on her body. Neuro: II - XII grossly intact.   Psych: Normal mood and affect.  Appropriate judgement and reason   Prior to presentation     05/10/21    Data Reviewed: I have personally reviewed following labs and imaging studies  CBC: Recent Labs  Lab 05/09/21 0734 05/10/21 0418  05/11/21 0439  WBC 16.5* 15.3* 11.1*  NEUTROABS 13.7*  --   --   HGB 12.1 10.7* 10.6*  HCT 37.3 32.5* 32.3*  MCV 80.9 79.3* 79.6*  PLT 248 190 189   Basic Metabolic Panel: Recent Labs  Lab 05/09/21 0734 05/10/21 0418 05/11/21 0439  NA 134* 132* 133*  K 4.1 4.0 3.9  CL 100 101 100  CO2 22 24 26   GLUCOSE 404* 289* 306*  BUN 18 10 12   CREATININE 0.71 0.62 0.68  CALCIUM 9.7 8.5* 8.7*  MG 1.8  --  1.7   GFR: Estimated Creatinine Clearance: 102.6 mL/min (by C-G formula based on SCr of 0.68 mg/dL). Liver Function Tests: Recent Labs  Lab 05/09/21 0734  AST 12*  ALT 11  ALKPHOS 99  BILITOT 0.3  PROT 7.9  ALBUMIN 4.0   No results for input(s): LIPASE, AMYLASE in the last 168 hours. No results for input(s): AMMONIA in the last 168 hours. Coagulation Profile: Recent Labs  Lab 05/09/21 0734  INR 0.9   Cardiac Enzymes: No results for input(s): CKTOTAL, CKMB, CKMBINDEX, TROPONINI in the last 168 hours. BNP (last 3 results) No results for input(s): PROBNP in the last 8760 hours. HbA1C: Recent Labs    05/09/21 0734  HGBA1C 8.2*   CBG: Recent Labs  Lab 05/10/21 2130 05/11/21 0747 05/11/21 1154 05/11/21 1619 05/11/21 2035  GLUCAP 290* 202* 336* 234* 306*   Lipid Profile: No results for input(s): CHOL, HDL, LDLCALC, TRIG, CHOLHDL, LDLDIRECT in the last 72 hours. Thyroid Function Tests: No results for input(s): TSH, T4TOTAL, FREET4, T3FREE, THYROIDAB in the last 72 hours. Anemia Panel: No results for input(s): VITAMINB12, FOLATE, FERRITIN, TIBC, IRON, RETICCTPCT in the last 72 hours. Sepsis Labs: Recent Labs  Lab 05/09/21 0734 05/09/21 0931  PROCALCITON <0.10  --   LATICACIDVEN 1.4 1.5    Recent Results (from the past 240 hour(s))  Blood culture (routine single)     Status: None (Preliminary result)   Collection Time: 05/09/21  7:34 AM   Specimen: BLOOD RIGHT HAND  Result Value Ref Range Status   Specimen Description BLOOD RIGHT HAND  Final   Special  Requests   Final    BOTTLES DRAWN AEROBIC AND ANAEROBIC Blood Culture results may not be optimal due to an inadequate volume of blood received in culture bottles   Culture   Final    NO GROWTH 2 DAYS Performed at [facility], [address] [place], [address] [place]    Report Status PENDING  Incomplete  Resp Panel by RT-PCR (Flu A&B, Covid) Nasopharyngeal Swab     Status: None   Collection Time: 05/09/21  7:58 AM   Specimen: Nasopharyngeal Swab; Nasopharyngeal(NP) swabs in vial transport medium  Result Value Ref Range Status   SARS Coronavirus 2 by RT PCR NEGATIVE NEGATIVE Final    Comment: (NOTE) SARS-CoV-2 target nucleic acids are NOT DETECTED.  The SARS-CoV-2 RNA is generally detectable in upper respiratory specimens during the acute phase of infection. The lowest concentration of SARS-CoV-2 viral copies this assay can  detect is 138 copies/mL. A negative result does not preclude SARS-Cov-2 infection and should not be used as the sole basis for treatment or other patient management decisions. A negative result may occur with  improper specimen collection/handling, submission of specimen other than nasopharyngeal swab, presence of viral mutation(s) within the areas targeted by this assay, and inadequate number of viral copies(<138 copies/mL). A negative result must be combined with clinical observations, patient history, and epidemiological information. The expected result is Negative.  Fact Sheet for Patients:  BloggerCourse.comhttps://www.fda.gov/media/152166/download  Fact Sheet for Healthcare Providers:  SeriousBroker.ithttps://www.fda.gov/media/152162/download  This test is no t yet approved or cleared by the [place] FDA and  has been authorized for detection and/or diagnosis of SARS-CoV-2 by FDA under an Emergency Use Authorization (EUA). This EUA will remain  in effect (meaning this test can be used) for the duration of the COVID-19 declaration under Section 564(b)(1) of the Act,  21 U.S.C.section 360bbb-3(b)(1), unless the authorization is terminated  or revoked sooner.       Influenza A by PCR NEGATIVE NEGATIVE Final   Influenza B by PCR NEGATIVE NEGATIVE Final    Comment: (NOTE) The Xpert Xpress SARS-CoV-2/FLU/RSV plus assay is intended as an aid in the diagnosis of influenza from Nasopharyngeal swab specimens and should not be used as a sole basis for treatment. Nasal washings and aspirates are unacceptable for Xpert Xpress SARS-CoV-2/FLU/RSV testing.  Fact Sheet for Patients: BloggerCourse.comhttps://www.fda.gov/media/152166/download  Fact Sheet for Healthcare Providers: SeriousBroker.ithttps://www.fda.gov/media/152162/download  This test is not yet approved or cleared by the [place] FDA and has been authorized for detection and/or diagnosis of SARS-CoV-2 by FDA under an Emergency Use Authorization (EUA). This EUA will remain in effect (meaning this test can be used) for the duration of the COVID-19 declaration under Section 564(b)(1) of the Act, 21 U.S.C. section 360bbb-3(b)(1), unless the authorization is terminated or revoked.  Performed at [facility], [address] [place], [place] [address]   MRSA PCR Screening     Status: Abnormal   Collection Time: 05/09/21 12:07 PM   Specimen: Nasopharyngeal  Result Value Ref Range Status   MRSA by PCR POSITIVE (A) NEGATIVE Final    Comment:        The GeneXpert MRSA Assay (FDA approved for NASAL specimens only), is one component of a comprehensive MRSA colonization surveillance program. It is not intended to diagnose MRSA infection nor to guide or monitor treatment for MRSA infections. RESULT CALLED TO, READ BACK BY AND VERIFIED WITH: A.RAMIREZ,RN AT 1442 ON 05/09/21 BY GM Performed at [facility], [address] [place], [place] [address]       Radiology Studies: No results found.   Scheduled Meds: . atorvastatin  80 mg Oral Daily  . Chlorhexidine Gluconate Cloth  6 each Topical Q0600   . doxycycline  100 mg Oral Q12H  . enoxaparin (LOVENOX) injection  0.5 mg/kg Subcutaneous Q24H  . gabapentin  300 mg Oral TID  . insulin aspart  0-20 Units Subcutaneous TID WC  . insulin aspart  0-5 Units Subcutaneous QHS  . insulin detemir  15 Units Subcutaneous BID  . loratadine  10 mg Oral Daily  . mupirocin ointment  1 application Nasal BID  . nicotine  21 mg Transdermal Daily  . polyethylene glycol  34 g Oral Q2H  . senna  1 tablet Oral BID   Continuous Infusions: . vancomycin 2,000 mg (05/11/21 2000)  . [START ON 05/12/2021] vancomycin       LOS: 2 days  Darlin Priestly, MD [facility] If 7PM-7AM, please contact night-coverage 05/11/2021, 8:36 PM

## 2021-05-11 NOTE — Consult Note (Addendum)
Pharmacy Antibiotic Note  Ashley Snow is a 50 y.o. female admitted on 05/09/2021 with Wound infection.  Pharmacy has been consulted for vancomycin dosing. Currently on ancef. ID following. Messaged Dr Sampson Goon about ancef. Plan to stop ancef and continue vancomycin.   Plan: Will give vancomycin 2000 mg x 1 loading dose followed by 750 mg q12H with a predicted AUC of 407. Goal AUC is 400-550. Plan to order vancomycin level prior to the 5th dose. Used Scr 0.8   Height: 5\' 8"  (172.7 cm) Weight: 99.3 kg (219 lb) IBW/kg (Calculated) : 63.9  Temp (24hrs), Avg:99.9 F (37.7 C), Min:98.1 F (36.7 C), Max:101.7 F (38.7 C)  Recent Labs  Lab 05/09/21 0734 05/09/21 0931 05/10/21 0418 05/11/21 0439  WBC 16.5*  --  15.3* 11.1*  CREATININE 0.71  --  0.62 0.68  LATICACIDVEN 1.4 1.5  --   --     Estimated Creatinine Clearance: 102.6 mL/min (by C-G formula based on SCr of 0.68 mg/dL).    Allergies  Allergen Reactions  . Iodine Rash    Antimicrobials this admission: 5/15 ancef >>  5/17 vancomycin >>   Dose adjustments this admission: None  Microbiology results: 5/15 BCx: pending 5/17 Wound Cx: pending  5/15 MRSA PCR: positive   Thank you for allowing pharmacy to be a part of this patient's care.  6/15, PharmD, BCPS 05/11/2021 5:06 PM

## 2021-05-12 DIAGNOSIS — B9689 Other specified bacterial agents as the cause of diseases classified elsewhere: Secondary | ICD-10-CM

## 2021-05-12 LAB — BASIC METABOLIC PANEL
Anion gap: 7 (ref 5–15)
BUN: 13 mg/dL (ref 6–20)
CO2: 27 mmol/L (ref 22–32)
Calcium: 9.3 mg/dL (ref 8.9–10.3)
Chloride: 99 mmol/L (ref 98–111)
Creatinine, Ser: 0.63 mg/dL (ref 0.44–1.00)
GFR, Estimated: >60 mL/min (ref >60)
Glucose, Bld: 307 mg/dL — ABNORMAL HIGH (ref 70–99)
Potassium: 4.2 mmol/L (ref 3.5–5.1)
Sodium: 133 mmol/L — ABNORMAL LOW (ref 135–145)

## 2021-05-12 LAB — GLUCOSE, CAPILLARY
Glucose-Capillary: 322 mg/dL — ABNORMAL HIGH (ref 70–99)
Glucose-Capillary: 286 mg/dL — ABNORMAL HIGH (ref 70–99)
Glucose-Capillary: 258 mg/dL — ABNORMAL HIGH (ref 70–99)

## 2021-05-12 LAB — CBC
HCT: 33.5 % — ABNORMAL LOW (ref 36.0–46.0)
Hemoglobin: 10.9 g/dL — ABNORMAL LOW (ref 12.0–15.0)
MCH: 26.2 pg (ref 26.0–34.0)
MCHC: 32.5 g/dL (ref 30.0–36.0)
MCV: 80.5 fL (ref 80.0–100.0)
Platelets: 248 10*3/uL (ref 150–400)
RBC: 4.16 MIL/uL (ref 3.87–5.11)
RDW: 13.2 % (ref 11.5–15.5)
WBC: 9.5 10*3/uL (ref 4.0–10.5)
nRBC: 0 % (ref 0.0–0.2)

## 2021-05-12 LAB — EHRLICHIA ANTIBODY PANEL
E chaffeensis (HGE) Ab, IgG: NEGATIVE
E chaffeensis (HGE) Ab, IgM: NEGATIVE
E. Chaffeensis (HME) IgM Titer: NEGATIVE
E.Chaffeensis (HME) IgG: NEGATIVE

## 2021-05-12 LAB — MAGNESIUM: Magnesium: 1.6 mg/dL — ABNORMAL LOW (ref 1.7–2.4)

## 2021-05-12 MED ORDER — MAGNESIUM SULFATE 2 GM/50ML IV SOLN
2.0000 g | Freq: Once | INTRAVENOUS | Status: AC
  Administered 2021-05-12: 17:00:00 2 g via INTRAVENOUS
  Filled 2021-05-12: qty 50

## 2021-05-12 MED ORDER — INSULIN ASPART 100 UNIT/ML IJ SOLN
3.0000 [IU] | Freq: Three times a day (TID) | INTRAMUSCULAR | Status: DC
  Administered 2021-05-12 (×3): 3 [IU] via SUBCUTANEOUS
  Filled 2021-05-12: qty 1

## 2021-05-12 MED ORDER — SODIUM CHLORIDE 0.9 % IV SOLN: INTRAVENOUS | Status: DC

## 2021-05-12 MED ORDER — MAGNESIUM SULFATE 2 GM/50ML IV SOLN
2.0000 g | Freq: Once | INTRAVENOUS | Status: AC
  Administered 2021-05-12: 09:00:00 2 g via INTRAVENOUS
  Filled 2021-05-12: qty 50

## 2021-05-12 MED ORDER — INSULIN DETEMIR 100 UNIT/ML ~~LOC~~ SOLN
20.0000 [IU] | Freq: Two times a day (BID) | SUBCUTANEOUS | Status: DC
  Administered 2021-05-12 – 2021-05-13 (×3): 20 [IU] via SUBCUTANEOUS
  Filled 2021-05-12 (×4): qty 0.2

## 2021-05-12 MED ORDER — TRAMADOL HCL 50 MG PO TABS
50.0000 mg | ORAL_TABLET | Freq: Four times a day (QID) | ORAL | Status: DC | PRN
  Administered 2021-05-12 – 2021-05-13 (×5): 50 mg via ORAL
  Filled 2021-05-12 (×5): qty 1

## 2021-05-12 NOTE — Consult Note (Signed)
[facility] SURGICAL CONSULTATION NOTE (initial) - cpt: 67124   HISTORY OF PRESENT ILLNESS (HPI):  50 y.o. female presented to [facility] ED initially on 05/15 for evaluation of fever following bug bite. Patient reports that she recently moved into a group home and her room is infested with bedbugs. She believes she may have gotten a bug bite to her mid back a few days prior to presentation. This was itchy at first but has been progressively worsening. At time of presentation, the area had become swollen, erythematous, and very painful. She endorses associated fevers with this. She denies any illicit drug use or EtOH use but endorses tobacco abuse. Patient was ultimately admitted to the medicine service for cellulitis. She has received vancomycin x1 dose which was switched to Ancef. ID was brought on board and doxycycline added to cover for possible tick borne illnesses. Cx obtained yesterday (05/17) growing rear gram positive cocci in clusters. She unfortunately continues to have significant swelling and pain to her mid-back.   Surgery is consulted by hospitalist physician Dr. Niel Hummer, MD in this context for evaluation and management of mid back cellulitis vs abscess.   PAST MEDICAL HISTORY (PMH):  Past Medical History:  Diagnosis Date  . Diabetes mellitus without complication ([place])   . Hyperlipidemia   . Hypertension   . Seasonal allergies      PAST SURGICAL HISTORY ([place]):  Past Surgical History:  Procedure Laterality Date  . CESAREAN SECTION    . KIDNEY STONE SURGERY    . TUBAL LIGATION       MEDICATIONS:  Prior to Admission medications   Medication Sig Start Date End Date Taking? Authorizing Provider  insulin NPH-regular Human (70-30) 100 UNIT/ML injection Inject 30 Units into the skin daily with supper. Per patient she usually took 70/30 in the evenings   Yes [provider]  levonorgestrel (MIRENA) 20 MCG/DAY IUD 1 each by Intrauterine route once.    Yes [provider]  atorvastatin (LIPITOR) 80 MG tablet Take 80 mg by mouth daily.    [provider]  Blood Pressure KIT Use as directed in the morning and at bedtime. 04/27/21   Iloabachie, Chioma E, NP  cetirizine (ZYRTEC ALLERGY) 10 MG tablet Take 1 tablet (10 mg total) by mouth daily. 04/27/21   Iloabachie, Chioma E, NP  gabapentin (NEURONTIN) 100 MG capsule Take 1 capsule (100 mg total) by mouth at bedtime. 04/27/21   Iloabachie, Chioma E, NP  metFORMIN (GLUCOPHAGE) 1000 MG tablet Take 1,000 mg by mouth in the morning and at bedtime.    [provider]  Multiple Vitamins-Minerals (MULTIVITAMIN WITH MINERALS) tablet Take 1 tablet by mouth daily.    [provider]  naproxen (NAPROSYN) 500 MG tablet Take 500 mg by mouth in the morning and at bedtime.    [provider]  polyethylene glycol powder (GLYCOLAX/MIRALAX) 17 GM/SCOOP powder Take 1 Container by mouth daily. 1 scoop daily    [provider]     ALLERGIES:  Allergies  Allergen Reactions  . Iodine Rash     SOCIAL HISTORY:  Social History   Socioeconomic History  . Marital status: Single    Spouse name: Not on file  . Number of children: Not on file  . Years of education: Not on file  . Highest education level: Not on file  Occupational History  . Not on file  Tobacco Use  . Smoking status: Current Every Day Smoker    Packs/day: 0.50  Types: Cigarettes  . Smokeless tobacco: Never Used  Vaping Use  . Vaping Use: Never used  Substance and Sexual Activity  . Alcohol use: Yes    Comment: social  . Drug use: Not Currently    Types: "Crack" cocaine    Comment: last use 09/2020  . Sexual activity: Yes    Birth control/protection: I.U.D.  Other Topics Concern  . Not on file  Social History Narrative  . Not on file   Social Determinants of Health   Financial Resource Strain: Not on file  Food Insecurity: No Food Insecurity  . Worried About Charity fundraiser in  the Last Year: Never true  . Ran Out of Food in the Last Year: Never true  Transportation Needs: No Transportation Needs  . Lack of Transportation (Medical): No  . Lack of Transportation (Non-Medical): No  Physical Activity: Not on file  Stress: Not on file  Social Connections: Not on file  Intimate Partner Violence: Not on file     FAMILY HISTORY:  Family History  Problem Relation Age of Onset  . Dementia Mother   . Hypotension Mother   . Diabetes Mother   . Alcoholism Father   . Hypertension Father   . Hyperlipidemia Father   . Dementia Maternal Grandmother   . Alzheimer's disease Maternal Grandmother       REVIEW OF SYSTEMS:  Review of Systems  Constitutional: Positive for fever. Negative for chills.  Cardiovascular: Negative for chest pain and palpitations.  Gastrointestinal: Negative for abdominal pain, nausea and vomiting.  Genitourinary: Negative for dysuria and urgency.  Musculoskeletal: Positive for back pain (Skin).  Skin:       + erythema, + cellulitis (mid-back)  All other systems reviewed and are negative.   VITAL SIGNS:  Temp:  [98.4 F (36.9 C)-101.1 F (38.4 C)] 99.2 F (37.3 C) (05/18 1100) Pulse Rate:  [85-102] 100 (05/18 1100) Resp:  [16-20] 18 (05/18 1100) BP: (109-148)/(75-98) 131/87 (05/18 1100) SpO2:  [96 %-100 %] 98 % (05/18 1100)     Height: _0  (172.7 cm) Weight: 99.3 kg BMI (Calculated): 33.31   INTAKE/OUTPUT:  05/17 0701 - 05/18 0700 In: 600 [P.O.:600] Out: -   PHYSICAL EXAM:  Physical Exam Vitals and nursing note reviewed. Exam conducted with a chaperone present.  Constitutional:      General: She is not in acute distress.    Appearance: Normal appearance. She is obese. She is not ill-appearing.  HENT:     Head: Normocephalic and atraumatic.  Eyes:     General: No scleral icterus.    Conjunctiva/sclera: Conjunctivae normal.  Pulmonary:     Effort: Pulmonary effort is normal.  Genitourinary:    Comments:  deferred Musculoskeletal:     Right lower leg: No edema.     Left lower leg: No edema.  Skin:    General: Skin is warm and dry.       Neurological:     General: No focal deficit present.     Mental Status: She is alert and oriented to person, place, and time.  Psychiatric:        Mood and Affect: Mood normal.        Behavior: Behavior normal.      Labs:  CBC Latest Ref Rng & Units 05/12/2021 05/11/2021 05/10/2021  WBC 4.0 - 10.5 K/uL 9.5 11.1(H) 15.3(H)  Hemoglobin 12.0 - 15.0 g/dL 10.9(L) 10.6(L) 10.7(L)  Hematocrit 36.0 - 46.0 % 33.5(L) 32.3(L) 32.5(L)  Platelets 150 -  400 K/uL 248 189 190   CMP Latest Ref Rng & Units 05/12/2021 05/11/2021 05/10/2021  Glucose 70 - 99 mg/dL 307(H) 306(H) 289(H)  BUN 6 - 20 mg/dL _0 Creatinine 0.44 - 1.00 mg/dL 0.63 0.68 0.62  Sodium 135 - 145 mmol/L 133(L) 133(L) 132(L)  Potassium 3.5 - 5.1 mmol/L 4.2 3.9 4.0  Chloride 98 - 111 mmol/L 99 100 101  CO2 22 - 32 mmol/L _1 Calcium 8.9 - 10.3 mg/dL 9.3 8.7(L) 8.5(L)  Total Protein 6.5 - 8.1 g/dL - - -  Total Bilirubin 0.3 - 1.2 mg/dL - - -  Alkaline Phos 38 - 126 U/L - - -  AST 15 - 41 U/L - - -  ALT 0 - 44 U/L - - -    Imaging studies:  No pertient imagine studies   Assessment/Plan: (ICD-10's: L03.312) 50 y.o. female with mid-back cellulitis with likely underlying abscess   - I do agree, she likely has an underlying abscess. I did offer bedside I&D; however, patient would prefer this done in the OR. I do think this is reasonable given her degree of pain currently on examination. This will also allow for complete evaluation. She has eaten today, so this will have to be tomorrow pending OR/asnesthesia availability  - All risks, benefits, and alternatives to above procedure(s) were discussed with the patient, all of her questions were answered to her expressed satisfaction, patient expresses she wishes to proceed, and informed consent was obtained.  - NPO at midnight  - continue IV  Abx (Vancomycin + doxycycline); ID on board; follow up Cx  - Pain control prn  - Further management per primary service; we will follow    All of the above findings and recommendations were discussed with the patient, and all of patient's questions were answered to her expressed satisfaction.  Thank you for the opportunity to participate in this patient's care.   -- Edison Simon, PA-C [facility] 05/12/2021, 3:33 PM [phone] M-F: 7am - 4pm

## 2021-05-12 NOTE — Progress Notes (Signed)
[facility] INFECTIOUS DISEASE PROGRESS NOTE Date of Admission:  05/09/2021     ID: Stefani Zahler is a 50 y.o. female with back abscess Principal Problem:   Cellulitis Active Problems:   Hypertension   Diabetes mellitus with hyperglycemia (HCC)   Nicotine dependence   Subjective: Fever curve decreasing. WBC down. GS with GPC in clusteres.  She reports she continues to remain sweaty and has moderate to severe pain at the site.  She reports a lot of pain yesterday after trying to express as much pus as possible.  ROS  Eleven systems are reviewed and negative except per hpi  Medications:  Antibiotics Given (last 72 hours)    Date/Time Action Medication Dose Rate   05/09/21 0912 New Bag/Given   vancomycin (VANCOCIN) IVPB 1000 mg/200 mL premix 1,000 mg 200 mL/hr   05/09/21 1447 Given   ceFAZolin (ANCEF) 1 g in sodium chloride 0.9 % 100 mL IVPB 1 g 200 mL/hr   05/09/21 2133 Given   ceFAZolin (ANCEF) 1 g in sodium chloride 0.9 % 100 mL IVPB 1 g 200 mL/hr   05/10/21 0616 Given   ceFAZolin (ANCEF) 1 g in sodium chloride 0.9 % 100 mL IVPB 1 g 200 mL/hr   05/10/21 1225 Given   ceFAZolin (ANCEF) 1 g in sodium chloride 0.9 % 100 mL IVPB 1 g 200 mL/hr   05/10/21 1717 Given   doxycycline (VIBRA-TABS) tablet 100 mg 100 mg    05/10/21 2141 Given   ceFAZolin (ANCEF) 1 g in sodium chloride 0.9 % 100 mL IVPB 1 g 200 mL/hr   05/10/21 2142 Given   doxycycline (VIBRA-TABS) tablet 100 mg 100 mg    05/11/21 0531 Given   ceFAZolin (ANCEF) 1 g in sodium chloride 0.9 % 100 mL IVPB 1 g 200 mL/hr   05/11/21 0834 Given   doxycycline (VIBRA-TABS) tablet 100 mg 100 mg    05/11/21 1610 Given   ceFAZolin (ANCEF) 1 g in sodium chloride 0.9 % 100 mL IVPB 1 g 200 mL/hr   05/11/21 2000 New Bag/Given   vancomycin (VANCOREADY) IVPB 2000 mg/400 mL 2,000 mg 200 mL/hr   05/11/21 2148 Given   doxycycline (VIBRA-TABS) tablet 100 mg 100 mg    05/12/21 0503 New Bag/Given   vancomycin (VANCOREADY) IVPB 750 mg/150  mL 750 mg 150 mL/hr     . atorvastatin  80 mg Oral Daily  . Chlorhexidine Gluconate Cloth  6 each Topical Q0600  . doxycycline  100 mg Oral Q12H  . enoxaparin (LOVENOX) injection  0.5 mg/kg Subcutaneous Q24H  . gabapentin  300 mg Oral TID  . insulin aspart  0-20 Units Subcutaneous TID WC  . insulin aspart  0-5 Units Subcutaneous QHS  . insulin detemir  20 Units Subcutaneous BID  . loratadine  10 mg Oral Daily  . mupirocin ointment  1 application Nasal BID  . nicotine  21 mg Transdermal Daily  . senna  1 tablet Oral BID    Objective: Vital signs in last 24 hours: Temp:  [98.4 F (36.9 C)-101.1 F (38.4 C)] 99.4 F (37.4 C) (05/18 0802) Pulse Rate:  [85-102] 102 (05/18 0802) Resp:  [16-20] 18 (05/18 0802) BP: (109-148)/(75-98) 134/90 (05/18 0802) SpO2:  [96 %-100 %] 98 % (05/18 0802) Physical Exam  Constitutional:  oriented to person, place, and time. appears well-developed and well-nourished. Obese HENT: [place]/AT, PERRLA, no scleral icterus Mouth/Throat: Oropharynx is clear and moist. No oropharyngeal exudate.  Cardiovascular: Normal rate, regular rhythm and normal heart sounds.  Exam reveals no gallop and no friction rub.  No murmur heard.  Pulmonary/Chest: Effort normal and breath sounds normal. No respiratory distress.  has no wheezes.  Neck = supple, no nuchal rigidity Abdominal: Soft. Bowel sounds are normal.  exhibits no distension. There is no tenderness.  Lymphadenopathy: no cervical adenopathy. No axillary adenopathy Neurological: alert and oriented to person, place, and time.  Skin: back with large indurated abscess, no longer draining Psychiatric: a normal mood and affect.  behavior is normal.      Lab Results Recent Labs    05/11/21 0439 05/12/21 0400  WBC 11.1* 9.5  HGB 10.6* 10.9*  HCT 32.3* 33.5*  NA 133* 133*  K 3.9 4.2  CL 100 99  CO2 26 27  BUN 12 13  CREATININE 0.68 0.63    Microbiology: Results for orders placed or performed during the  hospital encounter of 05/09/21  Blood culture (routine single)     Status: None (Preliminary result)   Collection Time: 05/09/21  7:34 AM   Specimen: BLOOD RIGHT HAND  Result Value Ref Range Status   Specimen Description BLOOD RIGHT HAND  Final   Special Requests   Final    BOTTLES DRAWN AEROBIC AND ANAEROBIC Blood Culture results may not be optimal due to an inadequate volume of blood received in culture bottles   Culture   Final    NO GROWTH 2 DAYS Performed at [facility], [address] [place], [place] [address]    Report Status PENDING  Incomplete  Resp Panel by RT-PCR (Flu A&B, Covid) Nasopharyngeal Swab     Status: None   Collection Time: 05/09/21  7:58 AM   Specimen: Nasopharyngeal Swab; Nasopharyngeal(NP) swabs in vial transport medium  Result Value Ref Range Status   SARS Coronavirus 2 by RT PCR NEGATIVE NEGATIVE Final    Comment: (NOTE) SARS-CoV-2 target nucleic acids are NOT DETECTED.  The SARS-CoV-2 RNA is generally detectable in upper respiratory specimens during the acute phase of infection. The lowest concentration of SARS-CoV-2 viral copies this assay can detect is 138 copies/mL. A negative result does not preclude SARS-Cov-2 infection and should not be used as the sole basis for treatment or other patient management decisions. A negative result may occur with  improper specimen collection/handling, submission of specimen other than nasopharyngeal swab, presence of viral mutation(s) within the areas targeted by this assay, and inadequate number of viral copies(<138 copies/mL). A negative result must be combined with clinical observations, patient history, and epidemiological information. The expected result is Negative.  Fact Sheet for Patients:  BloggerCourse.com  Fact Sheet for Healthcare Providers:  SeriousBroker.it  This test is no t yet approved or cleared by the [place] FDA and  has  been authorized for detection and/or diagnosis of SARS-CoV-2 by FDA under an Emergency Use Authorization (EUA). This EUA will remain  in effect (meaning this test can be used) for the duration of the COVID-19 declaration under Section 564(b)(1) of the Act, 21 U.S.C.section 360bbb-3(b)(1), unless the authorization is terminated  or revoked sooner.       Influenza A by PCR NEGATIVE NEGATIVE Final   Influenza B by PCR NEGATIVE NEGATIVE Final    Comment: (NOTE) The Xpert Xpress SARS-CoV-2/FLU/RSV plus assay is intended as an aid in the diagnosis of influenza from Nasopharyngeal swab specimens and should not be used as a sole basis for treatment. Nasal washings and aspirates are unacceptable for Xpert Xpress SARS-CoV-2/FLU/RSV testing.  Fact Sheet for Patients: BloggerCourse.com  Fact  Sheet for Healthcare Providers: SeriousBroker.it  This test is not yet approved or cleared by the [place] and has been authorized for detection and/or diagnosis of SARS-CoV-2 by FDA under an Emergency Use Authorization (EUA). This EUA will remain in effect (meaning this test can be used) for the duration of the COVID-19 declaration under Section 564(b)(1) of the Act, 21 U.S.C. section 360bbb-3(b)(1), unless the authorization is terminated or revoked.  Performed at [facility], [address] [place], [place] [address]   MRSA PCR Screening     Status: Abnormal   Collection Time: 05/09/21 12:07 PM   Specimen: Nasopharyngeal  Result Value Ref Range Status   MRSA by PCR POSITIVE (A) NEGATIVE Final    Comment:        The GeneXpert MRSA Assay (FDA approved for NASAL specimens only), is one component of a comprehensive MRSA colonization surveillance program. It is not intended to diagnose MRSA infection nor to guide or monitor treatment for MRSA infections. RESULT CALLED TO, READ BACK BY AND VERIFIED WITH: A.RAMIREZ,RN AT 1442  ON 05/09/21 BY GM Performed at [facility], [address] [place], [place] [address]   Aerobic Culture w Gram Stain (superficial specimen)     Status: None (Preliminary result)   Collection Time: 05/11/21  5:00 PM   Specimen: Wound  Result Value Ref Range Status   Specimen Description   Final    WOUND Performed at [facility], [address] [place], [place] [address]    Special Requests   Final    BACK Performed at [facility], [address] [place], [place] [address]    Gram Stain   Final    MODERATE WBC PRESENT, PREDOMINANTLY PMN RARE GRAM POSITIVE COCCI IN CLUSTERS    Culture   Final    TOO YOUNG TO READ Performed at [facility], [address] [place], [place] [address]    Report Status PENDING  Incomplete     Studies/Results: No results found.  Assessment/Plan: Lalisa Pech is a 50 y.o. female with DM, obesity admitted with pain and redness on back. Has abscess at this point, initially draining but now sealed over. 5/18 - site has sealed over and needs drainage   Recommendations Culture pending . Cont vanco. Consult surg for I and D of abscess Thank you very much for the consult. Will follow with you.  Mick Sell   05/12/2021, 8:24 AM

## 2021-05-12 NOTE — Progress Notes (Signed)
PROGRESS NOTE    Ashley KaufmannMelissa Snow  ZOX:[id number] DOB: 1971/08/26 DOA: 05/09/2021 PCP: Patient, No Pcp Per (Inactive)   Brief Narrative: 49 past medical history significant for insulin-dependent diabetes, obesity, hypertension, nicotine dependence who resides in a boardinghouse.  She presents to the ER for evaluation of pain, redness and swelling in her left upper back.  She has been having issues with bedbugs.  4 days prior to admission she noticed a bump in her left upper back that was pruritic.  She report fevers and chills, temperature 101    Assessment & Plan:   Principal Problem:   Cellulitis Active Problems:   Hypertension   Diabetes mellitus with hyperglycemia (HCC)   Nicotine dependence  1-Cellulitis of upper back, present on admission, sepsis present on admission -Patient presented with fever 101, tachycardia, tachypnea and leukocytosis -Was initially treated with Ancef.  Currently on vancomycin -She was transiently on doxycycline to cover for tickborne diseases. -ID consulted. Had pus drain  From abscess, culture growing gram positive cocci in cluster.  -resume tramadol for Pain PRN.  -[place], RMSF and Ehrlichia labs pending.  -Will consult General surgery per ID recommendations.   2-Diabetes with hyperglycemia: She will need assistance with medication Increase Levemir, continue with meal coverage and sliding scale insulin   3-Peripheral neuropathy: Continue with gabapentin  4-Current smoker: Continue with nicotine patch  5-Obesity: Need lifestyle modification  6-Hyperlipidemia:  continue with statins Hypomagnesemia; replete IV.  Anemia; monitor. Check anemia panel.  Mild hyponatremia; IV fluids.     Estimated body mass index is 33.3 kg/m as calculated from the following:   Height as of this encounter: 5\' 8"  (1.727 m).   Weight as of this encounter: 99.3 kg.   DVT prophylaxis: Lovenox Code Status: Full code Family Communication: care discussed with  patient.  Disposition Plan:  Status is: Inpatient  Remains inpatient appropriate because:IV treatments appropriate due to intensity of illness or inability to take PO   Dispo: The patient is from: Home              Anticipated d/c is to: Home              Patient currently is not medically stable to d/c.   Difficult to place patient No        Consultants:   ID  Procedures:   None  Antimicrobials:  Vancomycin   Subjective: She is complaining of back pain at abscess site.  Tramadol was helping.   Objective: Vitals:   05/11/21 2018 05/12/21 0038 05/12/21 0351 05/12/21 0802  BP: (!) 147/90 109/75 (!) 115/98 134/90  Pulse: 89 85 91 (!) 102  Resp: 18 16 20 18   Temp: (!) 100.6 F (38.1 C) 98.4 F (36.9 C) 98.7 F (37.1 C) 99.4 F (37.4 C)  TempSrc: Oral     SpO2: 100% 96% 100% 98%  Weight:      Height:        Intake/Output Summary (Last 24 hours) at 05/12/2021 0928 Last data filed at 05/12/2021 0313 Gross per 24 hour  Intake 600 ml  Output --  Net 600 ml   Filed Weights   05/09/21 0730  Weight: 99.3 kg    Examination:  General exam: Appears calm and comfortable  Respiratory system: Clear to auscultation. Respiratory effort normal. Cardiovascular system: S1 & S2 heard, RRR. No JVD, murmurs, rubs, gallops or clicks. No pedal edema. Gastrointestinal system: Abdomen is nondistended, soft and nontender. No organomegaly or masses felt. Normal bowel sounds heard. Central  nervous system: Alert and oriented. No focal neurological deficits. Extremities: Symmetric 5 x 5 power. Skin: Back; with redness, tenderness, induration.    Data Reviewed: I have personally reviewed following labs and imaging studies  CBC: Recent Labs  Lab 05/09/21 0734 05/10/21 0418 05/11/21 0439 05/12/21 0400  WBC 16.5* 15.3* 11.1* 9.5  NEUTROABS 13.7*  --   --   --   HGB 12.1 10.7* 10.6* 10.9*  HCT 37.3 32.5* 32.3* 33.5*  MCV 80.9 79.3* 79.6* 80.5  PLT 248 190 189 248    Basic Metabolic Panel: Recent Labs  Lab 05/09/21 0734 05/10/21 0418 05/11/21 0439 05/12/21 0400  NA 134* 132* 133* 133*  K 4.1 4.0 3.9 4.2  CL 100 101 100 99  CO2 22 24 26 27   GLUCOSE 404* 289* 306* 307*  BUN 18 10 12 13   CREATININE 0.71 0.62 0.68 0.63  CALCIUM 9.7 8.5* 8.7* 9.3  MG 1.8  --  1.7 1.6*   GFR: Estimated Creatinine Clearance: 102.6 mL/min (by C-G formula based on SCr of 0.63 mg/dL). Liver Function Tests: Recent Labs  Lab 05/09/21 0734  AST 12*  ALT 11  ALKPHOS 99  BILITOT 0.3  PROT 7.9  ALBUMIN 4.0   No results for input(s): LIPASE, AMYLASE in the last 168 hours. No results for input(s): AMMONIA in the last 168 hours. Coagulation Profile: Recent Labs  Lab 05/09/21 0734  INR 0.9   Cardiac Enzymes: No results for input(s): CKTOTAL, CKMB, CKMBINDEX, TROPONINI in the last 168 hours. BNP (last 3 results) No results for input(s): PROBNP in the last 8760 hours. HbA1C: No results for input(s): HGBA1C in the last 72 hours. CBG: Recent Labs  Lab 05/11/21 0747 05/11/21 1154 05/11/21 1619 05/11/21 2035 05/12/21 0801  GLUCAP 202* 336* 234* 306* 322*   Lipid Profile: No results for input(s): CHOL, HDL, LDLCALC, TRIG, CHOLHDL, LDLDIRECT in the last 72 hours. Thyroid Function Tests: No results for input(s): TSH, T4TOTAL, FREET4, T3FREE, THYROIDAB in the last 72 hours. Anemia Panel: No results for input(s): VITAMINB12, FOLATE, FERRITIN, TIBC, IRON, RETICCTPCT in the last 72 hours. Sepsis Labs: Recent Labs  Lab 05/09/21 0734 05/09/21 0931  PROCALCITON <0.10  --   LATICACIDVEN 1.4 1.5    Recent Results (from the past 240 hour(s))  Blood culture (routine single)     Status: None (Preliminary result)   Collection Time: 05/09/21  7:34 AM   Specimen: BLOOD RIGHT HAND  Result Value Ref Range Status   Specimen Description BLOOD RIGHT HAND  Final   Special Requests   Final    BOTTLES DRAWN AEROBIC AND ANAEROBIC Blood Culture results may not be  optimal due to an inadequate volume of blood received in culture bottles   Culture   Final    NO GROWTH 2 DAYS Performed at [facility], [address], [place], [address] [place]    Report Status PENDING  Incomplete  Resp Panel by RT-PCR (Flu A&B, Covid) Nasopharyngeal Swab     Status: None   Collection Time: 05/09/21  7:58 AM   Specimen: Nasopharyngeal Swab; Nasopharyngeal(NP) swabs in vial transport medium  Result Value Ref Range Status   SARS Coronavirus 2 by RT PCR NEGATIVE NEGATIVE Final    Comment: (NOTE) SARS-CoV-2 target nucleic acids are NOT DETECTED.  The SARS-CoV-2 RNA is generally detectable in upper respiratory specimens during the acute phase of infection. The lowest concentration of SARS-CoV-2 viral copies this assay can detect is 138 copies/mL. A negative result does not preclude  SARS-Cov-2 infection and should not be used as the sole basis for treatment or other patient management decisions. A negative result may occur with  improper specimen collection/handling, submission of specimen other than nasopharyngeal swab, presence of viral mutation(s) within the areas targeted by this assay, and inadequate number of viral copies(<138 copies/mL). A negative result must be combined with clinical observations, patient history, and epidemiological information. The expected result is Negative.  Fact Sheet for Patients:  BloggerCourse.com  Fact Sheet for Healthcare Providers:  SeriousBroker.it  This test is no t yet approved or cleared by the [place] FDA and  has been authorized for detection and/or diagnosis of SARS-CoV-2 by FDA under an Emergency Use Authorization (EUA). This EUA will remain  in effect (meaning this test can be used) for the duration of the COVID-19 declaration under Section 564(b)(1) of the Act, 21 U.S.C.section 360bbb-3(b)(1), unless the authorization is terminated  or revoked sooner.        Influenza A by PCR NEGATIVE NEGATIVE Final   Influenza B by PCR NEGATIVE NEGATIVE Final    Comment: (NOTE) The Xpert Xpress SARS-CoV-2/FLU/RSV plus assay is intended as an aid in the diagnosis of influenza from Nasopharyngeal swab specimens and should not be used as a sole basis for treatment. Nasal washings and aspirates are unacceptable for Xpert Xpress SARS-CoV-2/FLU/RSV testing.  Fact Sheet for Patients: BloggerCourse.com  Fact Sheet for Healthcare Providers: SeriousBroker.it  This test is not yet approved or cleared by the [place] FDA and has been authorized for detection and/or diagnosis of SARS-CoV-2 by FDA under an Emergency Use Authorization (EUA). This EUA will remain in effect (meaning this test can be used) for the duration of the COVID-19 declaration under Section 564(b)(1) of the Act, 21 U.S.C. section 360bbb-3(b)(1), unless the authorization is terminated or revoked.  Performed at [facility], [address] [place], [place] [address]   MRSA PCR Screening     Status: Abnormal   Collection Time: 05/09/21 12:07 PM   Specimen: Nasopharyngeal  Result Value Ref Range Status   MRSA by PCR POSITIVE (A) NEGATIVE Final    Comment:        The GeneXpert MRSA Assay (FDA approved for NASAL specimens only), is one component of a comprehensive MRSA colonization surveillance program. It is not intended to diagnose MRSA infection nor to guide or monitor treatment for MRSA infections. RESULT CALLED TO, READ BACK BY AND VERIFIED WITH: A.RAMIREZ,RN AT 1442 ON 05/09/21 BY GM Performed at [facility], [address] [place], [place] [address]   Aerobic Culture w Gram Stain (superficial specimen)     Status: None (Preliminary result)   Collection Time: 05/11/21  5:00 PM   Specimen: Wound  Result Value Ref Range Status   Specimen Description   Final    WOUND Performed at [facility], [address] [place], [place] [address]    Special Requests   Final    BACK Performed at [facility], [address] [place], [place] [address]    Gram Stain   Final    MODERATE WBC PRESENT, PREDOMINANTLY PMN RARE GRAM POSITIVE COCCI IN CLUSTERS Performed at [facility], [address] [place], [place] [address]    Culture PENDING  Incomplete   Report Status PENDING  Incomplete         Radiology Studies: No results found.      Scheduled Meds: . atorvastatin  80 mg Oral Daily  . Chlorhexidine Gluconate Cloth  6 each Topical Q0600  . doxycycline  100 mg Oral Q12H  . enoxaparin (LOVENOX) injection  0.5 mg/kg Subcutaneous Q24H  . gabapentin  300 mg Oral TID  . insulin aspart  0-20 Units Subcutaneous TID WC  . insulin aspart  0-5 Units Subcutaneous QHS  . insulin aspart  3 Units Subcutaneous TID WC  . insulin detemir  20 Units Subcutaneous BID  . loratadine  10 mg Oral Daily  . mupirocin ointment  1 application Nasal BID  . nicotine  21 mg Transdermal Daily  . senna  1 tablet Oral BID   Continuous Infusions: . sodium chloride    . magnesium sulfate bolus IVPB 2 g (05/12/21 0915)  . vancomycin 750 mg (05/12/21 0503)     LOS: 3 days    Time spent: 35 minutes    Sarinah Doetsch A Dacian Orrico, MD [facility]   If 7PM-7AM, please contact night-coverage [email or web address]  05/12/2021, 9:28 AM

## 2021-05-12 NOTE — Progress Notes (Signed)
Inpatient Diabetes Program Recommendations  AACE/ADA: New Consensus Statement on Inpatient Glycemic Control  Target Ranges:  Prepandial:   less than 140 mg/dL      Peak postprandial:   less than 180 mg/dL (1-2 hours)      Critically ill patients:  140 - 180 mg/dL   Results for Ashley Snow, Ashley Snow (MRN [id number]) as of 05/12/2021 09:39  Ref. Range 05/11/2021 07:47 05/11/2021 11:54 05/11/2021 16:19 05/11/2021 20:35 05/12/2021 08:01  Glucose-Capillary Latest Ref Range: 70 - 99 mg/dL 818 (H)  Novolog 7 units  Levemir 15 units 336 (H)  Novolog 15 units 234 (H)  Novolog 7 units 306 (H)  Novolog 4 units  Levemir 15 units 322 (H)  Novolog 15 units   Review of Glycemic Control  Diabetes history: DM2 Outpatient Diabetes medications: 70/30 30 units daily with supper, Metformin 1000 mg BID Current orders for Inpatient glycemic control: Levemir 20 units BID, Novolog 3 units TID with meals, Novolog 0-20 units TID with meals, Novolog 0-5 units QHS  Inpatient Diabetes Program Recommendations:    Insulin: Noted Levemir increased from 15 units BID to 20 units BID today and Novolog 3 units TID with meals for meal coverage was added to start at noon today.  Anticipate patient needs more meal coverage. May want to consider increasing meal coverage to Novolog 6 units TID with meals.  NOTE: Per chart, patient goes to [facility] and was last seen on 04/27/21. Inpatient diabetes coordinator spoke with patient on 05/10/21.  Thanks, Orlando Penner, RN, MSN, CDE Diabetes Coordinator Inpatient Diabetes Program [phone] (Team Pager from 8am to 5pm)
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
## 2021-05-12 NOTE — Anesthesia Preprocedure Evaluation (Addendum)
Anesthesia Evaluation  Patient identified by MRN, date of birth, ID band Patient awake    Reviewed: Allergy & Precautions, NPO status , Patient's Chart, lab work & pertinent test results  Airway Mallampati: III  TM Distance: <3 FB     Dental   Pulmonary Current Smoker,    Pulmonary exam normal        Cardiovascular hypertension, Pt. on medications Normal cardiovascular exam     Neuro/Psych  Headaches, negative psych ROS   GI/Hepatic negative GI ROS, Neg liver ROS,   Endo/Other  diabetes  Renal/GU negative Renal ROS  negative genitourinary   Musculoskeletal   Abdominal Normal abdominal exam  (+)   Peds negative pediatric ROS (+)  Hematology negative hematology ROS (+)   Anesthesia Other Findings Past Medical History: No date: Diabetes mellitus without complication (HCC) No date: Hyperlipidemia No date: Hypertension No date: Seasonal allergies   Reproductive/Obstetrics                            Anesthesia Physical Anesthesia Plan  ASA: II  Anesthesia Plan: General   Post-op Pain Management:    Induction: Intravenous  PONV Risk Score and Plan:   Airway Management Planned: Oral ETT  Additional Equipment:   Intra-op Plan:   Post-operative Plan: Extubation in OR  Informed Consent: I have reviewed the patients History and Physical, chart, labs and discussed the procedure including the risks, benefits and alternatives for the proposed anesthesia with the patient or authorized representative who has indicated his/her understanding and acceptance.     Dental advisory given  Plan Discussed with: CRNA and Surgeon  Anesthesia Plan Comments:         Anesthesia Quick Evaluation

## 2021-05-13 ENCOUNTER — Encounter
Admission: EM | Disposition: A | Discharge status: Home / Self Care | Payer: Self-pay | Source: Home / Self Care | Attending: Internal Medicine

## 2021-05-13 ENCOUNTER — Encounter: Payer: Self-pay | Admitting: Internal Medicine

## 2021-05-13 ENCOUNTER — Inpatient Hospital Stay: Payer: Medicaid Other | Admitting: Anesthesiology

## 2021-05-13 DIAGNOSIS — L02212 Cutaneous abscess of back [any part, except buttock]: Secondary | ICD-10-CM

## 2021-05-13 HISTORY — PX: INCISION AND DRAINAGE ABSCESS: SHX5864

## 2021-05-13 LAB — BASIC METABOLIC PANEL
Anion gap: 9 (ref 5–15)
BUN: 19 mg/dL (ref 6–20)
CO2: 27 mmol/L (ref 22–32)
Calcium: 8.8 mg/dL — ABNORMAL LOW (ref 8.9–10.3)
Chloride: 101 mmol/L (ref 98–111)
Creatinine, Ser: 0.68 mg/dL (ref 0.44–1.00)
GFR, Estimated: >60 mL/min (ref >60)
Glucose, Bld: 379 mg/dL — ABNORMAL HIGH (ref 70–99)
Potassium: 4.1 mmol/L (ref 3.5–5.1)
Sodium: 137 mmol/L (ref 135–145)

## 2021-05-13 LAB — URINE DRUG SCREEN, QUALITATIVE (ARMC ONLY)
Amphetamines, Ur Screen: NOT DETECTED
Barbiturates, Ur Screen: NOT DETECTED
Benzodiazepine, Ur Scrn: NOT DETECTED
Cannabinoid 50 Ng, Ur ~~LOC~~: NOT DETECTED
Cocaine Metabolite,Ur ~~LOC~~: NOT DETECTED
MDMA (Ecstasy)Ur Screen: NOT DETECTED
Methadone Scn, Ur: NOT DETECTED
Opiate, Ur Screen: NOT DETECTED
Phencyclidine (PCP) Ur S: NOT DETECTED
Tricyclic, Ur Screen: NOT DETECTED

## 2021-05-13 LAB — IRON AND TIBC
Iron: 18 ug/dL — ABNORMAL LOW (ref 28–170)
Saturation Ratios: 6 % — ABNORMAL LOW (ref 10.4–31.8)
TIBC: 294 ug/dL (ref 250–450)
UIBC: 276 ug/dL

## 2021-05-13 LAB — GLUCOSE, CAPILLARY
Glucose-Capillary: 304 mg/dL — ABNORMAL HIGH (ref 70–99)
Glucose-Capillary: 259 mg/dL — ABNORMAL HIGH (ref 70–99)
Glucose-Capillary: 163 mg/dL — ABNORMAL HIGH (ref 70–99)
Glucose-Capillary: 174 mg/dL — ABNORMAL HIGH (ref 70–99)
Glucose-Capillary: 317 mg/dL — ABNORMAL HIGH (ref 70–99)

## 2021-05-13 LAB — ROCKY MTN SPOTTED FVR ABS PNL(IGG+IGM)
RMSF IgG: NEGATIVE
RMSF IgM: 1.66 index — ABNORMAL HIGH (ref 0.00–0.89)

## 2021-05-13 LAB — FOLATE: Folate: 9.6 ng/mL (ref >5.9)

## 2021-05-13 LAB — CBC
HCT: 30.4 % — ABNORMAL LOW (ref 36.0–46.0)
Hemoglobin: 9.8 g/dL — ABNORMAL LOW (ref 12.0–15.0)
MCH: 26 pg (ref 26.0–34.0)
MCHC: 32.2 g/dL (ref 30.0–36.0)
MCV: 80.6 fL (ref 80.0–100.0)
Platelets: 268 10*3/uL (ref 150–400)
RBC: 3.77 MIL/uL — ABNORMAL LOW (ref 3.87–5.11)
RDW: 13.3 % (ref 11.5–15.5)
WBC: 7.2 10*3/uL (ref 4.0–10.5)
nRBC: 0 % (ref 0.0–0.2)

## 2021-05-13 LAB — FERRITIN: Ferritin: 92 ng/mL (ref 11–307)

## 2021-05-13 LAB — VITAMIN B12: Vitamin B-12: 254 pg/mL (ref 180–914)

## 2021-05-13 LAB — RETICULOCYTES
Immature Retic Fract: 9.9 % (ref 2.3–15.9)
RBC.: 3.99 MIL/uL (ref 3.87–5.11)
Retic Count, Absolute: 47.5 10*3/uL (ref 19.0–186.0)
Retic Ct Pct: 1.2 % (ref 0.4–3.1)

## 2021-05-13 LAB — MAGNESIUM: Magnesium: 1.7 mg/dL (ref 1.7–2.4)

## 2021-05-13 LAB — B. BURGDORFI ANTIBODIES

## 2021-05-13 LAB — MISC LABCORP TEST (SEND OUT): Labcorp test code: 164226

## 2021-05-13 SURGERY — INCISION AND DRAINAGE, ABSCESS
Anesthesia: General

## 2021-05-13 MED ORDER — MORPHINE SULFATE (PF) 2 MG/ML IV SOLN
2.0000 mg | INTRAVENOUS | Status: DC | PRN
  Administered 2021-05-14 – 2021-05-16 (×3): 2 mg via INTRAVENOUS
  Filled 2021-05-13 (×3): qty 1

## 2021-05-13 MED ORDER — BUPIVACAINE LIPOSOME 1.3 % IJ SUSP
INTRAMUSCULAR | Status: AC
  Filled 2021-05-13: qty 20

## 2021-05-13 MED ORDER — LIDOCAINE HCL (CARDIAC) PF 100 MG/5ML IV SOSY
PREFILLED_SYRINGE | INTRAVENOUS | Status: DC | PRN
  Administered 2021-05-13: 100 mg via INTRAVENOUS

## 2021-05-13 MED ORDER — INSULIN ASPART 100 UNIT/ML IJ SOLN
4.0000 [IU] | Freq: Three times a day (TID) | INTRAMUSCULAR | Status: DC
  Administered 2021-05-13: 4 [IU] via SUBCUTANEOUS
  Filled 2021-05-13: qty 1

## 2021-05-13 MED ORDER — FENTANYL CITRATE (PF) 100 MCG/2ML IJ SOLN
INTRAMUSCULAR | Status: AC
  Filled 2021-05-13: qty 2

## 2021-05-13 MED ORDER — PHENYLEPHRINE HCL (PRESSORS) 10 MG/ML IV SOLN
INTRAVENOUS | Status: DC | PRN
  Administered 2021-05-13: 80 ug via INTRAVENOUS

## 2021-05-13 MED ORDER — ROCURONIUM BROMIDE 100 MG/10ML IV SOLN
INTRAVENOUS | Status: DC | PRN
  Administered 2021-05-13: 40 mg via INTRAVENOUS
  Administered 2021-05-13: 15 mg via INTRAVENOUS

## 2021-05-13 MED ORDER — FENTANYL CITRATE (PF) 100 MCG/2ML IJ SOLN
INTRAMUSCULAR | Status: AC
  Administered 2021-05-13: 25 ug via INTRAVENOUS
  Filled 2021-05-13: qty 2

## 2021-05-13 MED ORDER — BUPIVACAINE LIPOSOME 1.3 % IJ SUSP
INTRAMUSCULAR | Status: DC | PRN
  Administered 2021-05-13: 20 mL

## 2021-05-13 MED ORDER — VANCOMYCIN HCL IN DEXTROSE 1-5 GM/200ML-% IV SOLN
1000.0000 mg | Freq: Once | INTRAVENOUS | Status: AC
  Administered 2021-05-13: 1000 mg via INTRAVENOUS

## 2021-05-13 MED ORDER — OXYCODONE HCL 5 MG PO TABS
5.0000 mg | ORAL_TABLET | ORAL | Status: DC | PRN
  Administered 2021-05-13 – 2021-05-16 (×5): 5 mg via ORAL
  Filled 2021-05-13 (×5): qty 1

## 2021-05-13 MED ORDER — INSULIN DETEMIR 100 UNIT/ML ~~LOC~~ SOLN
25.0000 [IU] | Freq: Two times a day (BID) | SUBCUTANEOUS | Status: DC
  Administered 2021-05-13 – 2021-05-14 (×3): 25 [IU] via SUBCUTANEOUS
  Filled 2021-05-13 (×5): qty 0.25

## 2021-05-13 MED ORDER — PROPOFOL 10 MG/ML IV BOLUS
INTRAVENOUS | Status: AC
  Filled 2021-05-13: qty 20

## 2021-05-13 MED ORDER — KETOROLAC TROMETHAMINE 30 MG/ML IJ SOLN
30.0000 mg | Freq: Four times a day (QID) | INTRAMUSCULAR | Status: DC
  Administered 2021-05-13 – 2021-05-16 (×11): 30 mg via INTRAVENOUS
  Filled 2021-05-13 (×11): qty 1

## 2021-05-13 MED ORDER — ROCURONIUM BROMIDE 100 MG/10ML IV SOLN: INTRAVENOUS | Status: DC | PRN

## 2021-05-13 MED ORDER — FENTANYL CITRATE (PF) 100 MCG/2ML IJ SOLN
INTRAMUSCULAR | Status: DC | PRN
  Administered 2021-05-13 (×2): 50 ug via INTRAVENOUS

## 2021-05-13 MED ORDER — ACETAMINOPHEN 500 MG PO TABS
1000.0000 mg | ORAL_TABLET | Freq: Four times a day (QID) | ORAL | Status: DC | PRN
  Administered 2021-05-13 – 2021-05-14 (×2): 1000 mg via ORAL
  Filled 2021-05-13 (×2): qty 2

## 2021-05-13 MED ORDER — MIDAZOLAM HCL 2 MG/2ML IJ SOLN
INTRAMUSCULAR | Status: AC
  Filled 2021-05-13: qty 2

## 2021-05-13 MED ORDER — PROPOFOL 10 MG/ML IV BOLUS
INTRAVENOUS | Status: DC | PRN
  Administered 2021-05-13: 120 mg via INTRAVENOUS

## 2021-05-13 MED ORDER — MAGNESIUM OXIDE -MG SUPPLEMENT 400 (240 MG) MG PO TABS
200.0000 mg | ORAL_TABLET | Freq: Two times a day (BID) | ORAL | Status: DC
  Administered 2021-05-13 – 2021-05-17 (×8): 200 mg via ORAL
  Filled 2021-05-13 (×9): qty 1

## 2021-05-13 MED ORDER — DAKINS (1/4 STRENGTH) 0.125 % EX SOLN
CUTANEOUS | Status: AC
  Filled 2021-05-13: qty 473

## 2021-05-13 MED ORDER — BUPIVACAINE-EPINEPHRINE (PF) 0.5% -1:200000 IJ SOLN
INTRAMUSCULAR | Status: AC
  Filled 2021-05-13: qty 30

## 2021-05-13 MED ORDER — ONDANSETRON HCL 4 MG/2ML IJ SOLN
4.0000 mg | Freq: Once | INTRAMUSCULAR | Status: DC | PRN
Start: 2021-05-13 — End: 2021-05-13

## 2021-05-13 MED ORDER — MIDAZOLAM HCL 2 MG/2ML IJ SOLN
INTRAMUSCULAR | Status: DC | PRN
  Administered 2021-05-13: 2 mg via INTRAVENOUS

## 2021-05-13 MED ORDER — VITAMIN B-12 100 MCG PO TABS
100.0000 ug | ORAL_TABLET | Freq: Every day | ORAL | Status: DC
  Administered 2021-05-13 – 2021-05-17 (×5): 100 ug via ORAL
  Filled 2021-05-13 (×5): qty 1

## 2021-05-13 MED ORDER — FENTANYL CITRATE (PF) 100 MCG/2ML IJ SOLN
25.0000 ug | INTRAMUSCULAR | Status: DC | PRN
  Administered 2021-05-13: 25 ug via INTRAVENOUS

## 2021-05-13 MED ORDER — LIDOCAINE HCL (PF) 2 % IJ SOLN
INTRAMUSCULAR | Status: AC
  Filled 2021-05-13: qty 5

## 2021-05-13 MED ORDER — BUPIVACAINE-EPINEPHRINE 0.5% -1:200000 IJ SOLN
INTRAMUSCULAR | Status: DC | PRN
  Administered 2021-05-13: 30 mL

## 2021-05-13 MED ORDER — VANCOMYCIN HCL IN DEXTROSE 1-5 GM/200ML-% IV SOLN
INTRAVENOUS | Status: AC
  Filled 2021-05-13: qty 200

## 2021-05-13 SURGICAL SUPPLY — 31 items
BNDG GAUZE 4.5X4.1 6PLY STRL (MISCELLANEOUS) ×2 IMPLANT
CANISTER SUCT 1200ML W/VALVE (MISCELLANEOUS) ×2 IMPLANT
CHLORAPREP W/TINT 26 (MISCELLANEOUS) ×2 IMPLANT
CNTNR SPEC 2.5X3XGRAD LEK (MISCELLANEOUS) ×1
CONT SPEC 4OZ STER OR WHT (MISCELLANEOUS) ×1
CONTAINER SPEC 2.5X3XGRAD LEK (MISCELLANEOUS) ×1 IMPLANT
COVER WAND RF STERILE (DRAPES) ×2 IMPLANT
DRAIN PENROSE 12X.25 LTX STRL (MISCELLANEOUS) ×2 IMPLANT
DRAPE LAPAROTOMY 77X122 PED (DRAPES) ×2 IMPLANT
DRSG GAUZE FLUFF 36X18 (GAUZE/BANDAGES/DRESSINGS) ×2 IMPLANT
ELECT REM PT RETURN 9FT ADLT (ELECTROSURGICAL) ×2
ELECTRODE REM PT RTRN 9FT ADLT (ELECTROSURGICAL) ×1 IMPLANT
GAUZE SPONGE 4X4 12PLY STRL (GAUZE/BANDAGES/DRESSINGS) ×2 IMPLANT
GLOVE SURG SYN 7.0 (GLOVE) ×4 IMPLANT
GLOVE SURG SYN 7.5  E (GLOVE) ×2
GLOVE SURG SYN 7.5 E (GLOVE) ×2 IMPLANT
GOWN STRL REUS W/ TWL LRG LVL3 (GOWN DISPOSABLE) ×2 IMPLANT
GOWN STRL REUS W/TWL LRG LVL3 (GOWN DISPOSABLE) ×2
KIT TURNOVER KIT A (KITS) ×2 IMPLANT
LABEL OR SOLS (LABEL) ×2 IMPLANT
MANIFOLD NEPTUNE II (INSTRUMENTS) ×2 IMPLANT
NEEDLE HYPO 22GX1.5 SAFETY (NEEDLE) ×2 IMPLANT
NS IRRIG 500ML POUR BTL (IV SOLUTION) ×2 IMPLANT
PACK BASIN MINOR ARMC (MISCELLANEOUS) ×2 IMPLANT
PAD ABD DERMACEA PRESS 5X9 (GAUZE/BANDAGES/DRESSINGS) ×2 IMPLANT
SOL PREP PVP 2OZ (MISCELLANEOUS) ×2
SOLUTION PREP PVP 2OZ (MISCELLANEOUS) ×1 IMPLANT
SPONGE LAP 18X18 RF (DISPOSABLE) ×4 IMPLANT
SWAB CULTURE AMIES ANAERIB BLU (MISCELLANEOUS) ×4 IMPLANT
SYR 20ML LL LF (SYRINGE) ×2 IMPLANT
SYR BULB IRRIG 60ML STRL (SYRINGE) ×2 IMPLANT

## 2021-05-13 NOTE — Progress Notes (Signed)
PROGRESS NOTE    Ashley Snow  KDX:[id number] DOB: 03-Oct-1971 DOA: 05/09/2021 PCP: Patient, No Pcp Per (Inactive)   Brief Narrative: 49 past medical history significant for insulin-dependent diabetes, obesity, hypertension, nicotine dependence who resides in a boardinghouse.  She presents to the ER for evaluation of pain, redness and swelling in her left upper back.  She has been having issues with bedbugs.  4 days prior to admission she noticed a bump in her left upper back that was pruritic.  She report fevers and chills, temperature 101    Assessment & Plan:   Principal Problem:   Cellulitis Active Problems:   Hypertension   Diabetes mellitus with hyperglycemia (HCC)   Nicotine dependence  1-Cellulitis/Abscess  of upper back, present on admission, sepsis present on admission -Patient presented with fever 101, tachycardia, tachypnea and leukocytosis -Was initially treated with Ancef.  Currently on vancomycin -She is  on doxycycline to cover for tickborne diseases. -ID consulted. Had pus drain  From abscess, culture growing Staph Aureus.  -resume tramadol for Pain PRN.  -Burgdorfi test was discontinue ,  RMSF positive and Ehrlichia negative. On Doxy  -general Sx consulted. Plan for I and D in the OR today.    2-Diabetes with hyperglycemia: She will need assistance with medication Increase Levemir to 25 units , increase meal coverage to 4 units.    3-Peripheral neuropathy: Continue with gabapentin  4-Current smoker: Continue with nicotine patch  5-Obesity: Need lifestyle modification  6-Hyperlipidemia:  continue with statins Hypomagnesemia; Replaced.  Anemia; monitor. Iron deficiency. She will need iron when infection is controlled. Will start B 12 supplement.  Mild hyponatremia; IV fluids.     Estimated body mass index is 33.3 kg/m as calculated from the following:   Height as of this encounter: 5\' 8"  (1.727 m).   Weight as of this encounter: 99.3 kg.   DVT  prophylaxis: Lovenox Code Status: Full code Family Communication: care discussed with patient.  Disposition Plan:  Status is: Inpatient  Remains inpatient appropriate because:IV treatments appropriate due to intensity of illness or inability to take PO   Dispo: The patient is from: Home              Anticipated d/c is to: Home              Patient currently is not medically stable to d/c.   Difficult to place patient No        Consultants:   ID  Procedures:   None  Antimicrobials:  Vancomycin   Subjective: She is awaiting to go to surgery.  Complaining of back pain.   Objective: Vitals:   05/12/21 2358 05/13/21 0407 05/13/21 1021 05/13/21 1232  BP: 125/75 125/81 133/85 140/81  Pulse: 93 93 87 84  Resp: 18 16 18 18   Temp: 98.9 F (37.2 C) 98.8 F (37.1 C) 98.6 F (37 C) 98.5 F (36.9 C)  TempSrc:    Oral  SpO2: 96% 96% 97% 97%  Weight:      Height:        Intake/Output Summary (Last 24 hours) at 05/13/2021 1400 Last data filed at 05/12/2021 2235 Gross per 24 hour  Intake 480 ml  Output --  Net 480 ml   Filed Weights   05/09/21 0730  Weight: 99.3 kg    Examination:  General exam: NAD Respiratory system: CTA Cardiovascular system: S 1, S 2 RRR Gastrointestinal system: BS present, soft, nt Central nervous system: Non focal Extremities: no edema Skin: Back; with  redness, tenderness, induration.    Data Reviewed: I have personally reviewed following labs and imaging studies  CBC: Recent Labs  Lab 05/09/21 0734 05/10/21 0418 05/11/21 0439 05/12/21 0400 05/13/21 0356  WBC 16.5* 15.3* 11.1* 9.5 7.2  NEUTROABS 13.7*  --   --   --   --   HGB 12.1 10.7* 10.6* 10.9* 9.8*  HCT 37.3 32.5* 32.3* 33.5* 30.4*  MCV 80.9 79.3* 79.6* 80.5 80.6  PLT 248 190 189 248 268   Basic Metabolic Panel: Recent Labs  Lab 05/09/21 0734 05/10/21 0418 05/11/21 0439 05/12/21 0400 05/13/21 0356  NA 134* 132* 133* 133* 137  K 4.1 4.0 3.9 4.2 4.1  CL 100  101 100 99 101  CO2 22 24 26 27 27   GLUCOSE 404* 289* 306* 307* 379*  BUN 18 10 12 13 19   CREATININE 0.71 0.62 0.68 0.63 0.68  CALCIUM 9.7 8.5* 8.7* 9.3 8.8*  MG 1.8  --  1.7 1.6* 1.7   GFR: Estimated Creatinine Clearance: 102.6 mL/min (by C-G formula based on SCr of 0.68 mg/dL). Liver Function Tests: Recent Labs  Lab 05/09/21 0734  AST 12*  ALT 11  ALKPHOS 99  BILITOT 0.3  PROT 7.9  ALBUMIN 4.0   No results for input(s): LIPASE, AMYLASE in the last 168 hours. No results for input(s): AMMONIA in the last 168 hours. Coagulation Profile: Recent Labs  Lab 05/09/21 0734  INR 0.9   Cardiac Enzymes: No results for input(s): CKTOTAL, CKMB, CKMBINDEX, TROPONINI in the last 168 hours. BNP (last 3 results) No results for input(s): PROBNP in the last 8760 hours. HbA1C: No results for input(s): HGBA1C in the last 72 hours. CBG: Recent Labs  Lab 05/12/21 0801 05/12/21 1137 05/12/21 1556 05/13/21 0826 05/13/21 1213  GLUCAP 322* 286* 258* 304* 259*   Lipid Profile: No results for input(s): CHOL, HDL, LDLCALC, TRIG, CHOLHDL, LDLDIRECT in the last 72 hours. Thyroid Function Tests: No results for input(s): TSH, T4TOTAL, FREET4, T3FREE, THYROIDAB in the last 72 hours. Anemia Panel: Recent Labs    05/13/21 0356  VITAMINB12 254  FOLATE 9.6  FERRITIN 92  TIBC 294  IRON 18*  RETICCTPCT 1.2   Sepsis Labs: Recent Labs  Lab 05/09/21 0734 05/09/21 0931  PROCALCITON <0.10  --   LATICACIDVEN 1.4 1.5    Recent Results (from the past 240 hour(s))  Blood culture (routine single)     Status: None (Preliminary result)   Collection Time: 05/09/21  7:34 AM   Specimen: BLOOD RIGHT HAND  Result Value Ref Range Status   Specimen Description BLOOD RIGHT HAND  Final   Special Requests   Final    BOTTLES DRAWN AEROBIC AND ANAEROBIC Blood Culture results may not be optimal due to an inadequate volume of blood received in culture bottles   Culture   Final    NO GROWTH 4  DAYS Performed at [facility], [address] [place], [address] [place]    Report Status PENDING  Incomplete  Resp Panel by RT-PCR (Flu A&B, Covid) Nasopharyngeal Swab     Status: None   Collection Time: 05/09/21  7:58 AM   Specimen: Nasopharyngeal Swab; Nasopharyngeal(NP) swabs in vial transport medium  Result Value Ref Range Status   SARS Coronavirus 2 by RT PCR NEGATIVE NEGATIVE Final    Comment: (NOTE) SARS-CoV-2 target nucleic acids are NOT DETECTED.  The SARS-CoV-2 RNA is generally detectable in upper respiratory specimens during the acute phase of infection. The lowest concentration of SARS-CoV-2  viral copies this assay can detect is 138 copies/mL. A negative result does not preclude SARS-Cov-2 infection and should not be used as the sole basis for treatment or other patient management decisions. A negative result may occur with  improper specimen collection/handling, submission of specimen other than nasopharyngeal swab, presence of viral mutation(s) within the areas targeted by this assay, and inadequate number of viral copies(<138 copies/mL). A negative result must be combined with clinical observations, patient history, and epidemiological information. The expected result is Negative.  Fact Sheet for Patients:  BloggerCourse.comhttps://www.fda.gov/media/152166/download  Fact Sheet for Healthcare Providers:  SeriousBroker.ithttps://www.fda.gov/media/152162/download  This test is no t yet approved or cleared by the [place] FDA and  has been authorized for detection and/or diagnosis of SARS-CoV-2 by FDA under an Emergency Use Authorization (EUA). This EUA will remain  in effect (meaning this test can be used) for the duration of the COVID-19 declaration under Section 564(b)(1) of the Act, 21 U.S.C.section 360bbb-3(b)(1), unless the authorization is terminated  or revoked sooner.       Influenza A by PCR NEGATIVE NEGATIVE Final   Influenza B by PCR NEGATIVE NEGATIVE Final     Comment: (NOTE) The Xpert Xpress SARS-CoV-2/FLU/RSV plus assay is intended as an aid in the diagnosis of influenza from Nasopharyngeal swab specimens and should not be used as a sole basis for treatment. Nasal washings and aspirates are unacceptable for Xpert Xpress SARS-CoV-2/FLU/RSV testing.  Fact Sheet for Patients: BloggerCourse.comhttps://www.fda.gov/media/152166/download  Fact Sheet for Healthcare Providers: SeriousBroker.ithttps://www.fda.gov/media/152162/download  This test is not yet approved or cleared by the [place] FDA and has been authorized for detection and/or diagnosis of SARS-CoV-2 by FDA under an Emergency Use Authorization (EUA). This EUA will remain in effect (meaning this test can be used) for the duration of the COVID-19 declaration under Section 564(b)(1) of the Act, 21 U.S.C. section 360bbb-3(b)(1), unless the authorization is terminated or revoked.  Performed at [facility], [address] [place], [place] [address]   MRSA PCR Screening     Status: Abnormal   Collection Time: 05/09/21 12:07 PM   Specimen: Nasopharyngeal  Result Value Ref Range Status   MRSA by PCR POSITIVE (A) NEGATIVE Final    Comment:        The GeneXpert MRSA Assay (FDA approved for NASAL specimens only), is one component of a comprehensive MRSA colonization surveillance program. It is not intended to diagnose MRSA infection nor to guide or monitor treatment for MRSA infections. RESULT CALLED TO, READ BACK BY AND VERIFIED WITH: A.RAMIREZ,RN AT 1442 ON 05/09/21 BY GM Performed at [facility], [address] [place], [place] [address]   Aerobic Culture w Gram Stain (superficial specimen)     Status: None (Preliminary result)   Collection Time: 05/11/21  5:00 PM   Specimen: Wound  Result Value Ref Range Status   Specimen Description   Final    WOUND Performed at [facility], [address] [place], [place] [address]    Special Requests   Final    BACK Performed at  [facility], [address] [place], [place] [address]    Gram Stain   Final    MODERATE WBC PRESENT, PREDOMINANTLY PMN RARE GRAM POSITIVE COCCI IN CLUSTERS    Culture   Final    FEW STAPHYLOCOCCUS AUREUS SUSCEPTIBILITIES TO FOLLOW Performed at [facility], [address] [place], [place] [address]    Report Status PENDING  Incomplete  Radiology Studies: No results found.      Scheduled Meds: . [MAR Hold] atorvastatin  80 mg Oral Daily  . [MAR Hold] Chlorhexidine Gluconate Cloth  6 each Topical Q0600  . [MAR Hold] doxycycline  100 mg Oral Q12H  . [MAR Hold] enoxaparin (LOVENOX) injection  0.5 mg/kg Subcutaneous Q24H  . [MAR Hold] gabapentin  300 mg Oral TID  . [MAR Hold] insulin aspart  0-20 Units Subcutaneous TID WC  . [MAR Hold] insulin aspart  0-5 Units Subcutaneous QHS  . insulin aspart  4 Units Subcutaneous TID WC  . insulin detemir  25 Units Subcutaneous BID  . [MAR Hold] loratadine  10 mg Oral Daily  . [MAR Hold] mupirocin ointment  1 application Nasal BID  . [MAR Hold] nicotine  21 mg Transdermal Daily  . [MAR Hold] senna  1 tablet Oral BID   Continuous Infusions: . sodium chloride 75 mL/hr at 05/13/21 0505  . [MAR Hold] vancomycin 750 mg (05/13/21 0507)     LOS: 4 days    Time spent: 35 minutes    Tionne Carelli A Cantrell Martus, MD [facility]   If 7PM-7AM, please contact night-coverage [email or web address]  05/13/2021, 2:00 PM

## 2021-05-13 NOTE — Progress Notes (Signed)
[facility] INFECTIOUS DISEASE PROGRESS NOTE Date of Admission:  05/09/2021     ID: Marketta Yehle is a 50 y.o. female with back abscess Principal Problem:   Cellulitis Active Problems:   Hypertension   Diabetes mellitus with hyperglycemia (HCC)   Nicotine dependence   Subjective: Fever curve decreasing. WBC down. GS with staph aureus IN OR so not seen    Lab Results Recent Labs    05/12/21 0400 05/13/21 0356  WBC 9.5 7.2  HGB 10.9* 9.8*  HCT 33.5* 30.4*  NA 133* 137  K 4.2 4.1  CL 99 101  CO2 27 27  BUN 13 19  CREATININE 0.63 0.68    Microbiology: Results for orders placed or performed during the hospital encounter of 05/09/21  Blood culture (routine single)     Status: None (Preliminary result)   Collection Time: 05/09/21  7:34 AM   Specimen: BLOOD RIGHT HAND  Result Value Ref Range Status   Specimen Description BLOOD RIGHT HAND  Final   Special Requests   Final    BOTTLES DRAWN AEROBIC AND ANAEROBIC Blood Culture results may not be optimal due to an inadequate volume of blood received in culture bottles   Culture   Final    NO GROWTH 4 DAYS Performed at [facility], [address] [place], [place] [address]    Report Status PENDING  Incomplete  Resp Panel by RT-PCR (Flu A&B, Covid) Nasopharyngeal Swab     Status: None   Collection Time: 05/09/21  7:58 AM   Specimen: Nasopharyngeal Swab; Nasopharyngeal(NP) swabs in vial transport medium  Result Value Ref Range Status   SARS Coronavirus 2 by RT PCR NEGATIVE NEGATIVE Final    Comment: (NOTE) SARS-CoV-2 target nucleic acids are NOT DETECTED.  The SARS-CoV-2 RNA is generally detectable in upper respiratory specimens during the acute phase of infection. The lowest concentration of SARS-CoV-2 viral copies this assay can detect is 138 copies/mL. A negative result does not preclude SARS-Cov-2 infection and should not be used as the sole basis for treatment or other patient management decisions. A  negative result may occur with  improper specimen collection/handling, submission of specimen other than nasopharyngeal swab, presence of viral mutation(s) within the areas targeted by this assay, and inadequate number of viral copies(<138 copies/mL). A negative result must be combined with clinical observations, patient history, and epidemiological information. The expected result is Negative.  Fact Sheet for Patients:  BloggerCourse.com  Fact Sheet for Healthcare Providers:  SeriousBroker.it  This test is no t yet approved or cleared by the [place] FDA and  has been authorized for detection and/or diagnosis of SARS-CoV-2 by FDA under an Emergency Use Authorization (EUA). This EUA will remain  in effect (meaning this test can be used) for the duration of the COVID-19 declaration under Section 564(b)(1) of the Act, 21 U.S.C.section 360bbb-3(b)(1), unless the authorization is terminated  or revoked sooner.       Influenza A by PCR NEGATIVE NEGATIVE Final   Influenza B by PCR NEGATIVE NEGATIVE Final    Comment: (NOTE) The Xpert Xpress SARS-CoV-2/FLU/RSV plus assay is intended as an aid in the diagnosis of influenza from Nasopharyngeal swab specimens and should not be used as a sole basis for treatment. Nasal washings and aspirates are unacceptable for Xpert Xpress SARS-CoV-2/FLU/RSV testing.  Fact Sheet for Patients: BloggerCourse.com  Fact Sheet for Healthcare Providers: SeriousBroker.it  This test is not yet approved or cleared by the [place] and has been authorized for  detection and/or diagnosis of SARS-CoV-2 by FDA under an Emergency Use Authorization (EUA). This EUA will remain in effect (meaning this test can be used) for the duration of the COVID-19 declaration under Section 564(b)(1) of the Act, 21 U.S.C. section 360bbb-3(b)(1), unless the authorization  is terminated or revoked.  Performed at [facility], [address] [place], [place] [address]   MRSA PCR Screening     Status: Abnormal   Collection Time: 05/09/21 12:07 PM   Specimen: Nasopharyngeal  Result Value Ref Range Status   MRSA by PCR POSITIVE (A) NEGATIVE Final    Comment:        The GeneXpert MRSA Assay (FDA approved for NASAL specimens only), is one component of a comprehensive MRSA colonization surveillance program. It is not intended to diagnose MRSA infection nor to guide or monitor treatment for MRSA infections. RESULT CALLED TO, READ BACK BY AND VERIFIED WITH: A.RAMIREZ,RN AT 1442 ON 05/09/21 BY GM Performed at [facility], [address], [place], [place] [address]   Aerobic Culture w Gram Stain (superficial specimen)     Status: None (Preliminary result)   Collection Time: 05/11/21  5:00 PM   Specimen: Wound  Result Value Ref Range Status   Specimen Description   Final    WOUND Performed at [facility], [address], [place], [place] [address]    Special Requests   Final    BACK Performed at [facility], [address] [place], [place] [address]    Gram Stain   Final    MODERATE WBC PRESENT, PREDOMINANTLY PMN RARE GRAM POSITIVE COCCI IN CLUSTERS    Culture   Final    FEW STAPHYLOCOCCUS AUREUS SUSCEPTIBILITIES TO FOLLOW Performed at [facility], [address], [place], [place] [address]    Report Status PENDING  Incomplete     Studies/Results: No results found.  Assessment/Plan: Akili Doberstein is a 50 y.o. female with DM, obesity admitted with pain and redness on back. Has abscess at this point, initially draining but now sealed over. 5/18 - site has sealed over and needs drainage   Recommendations Once I and D done and ok for dc from surgical point of view can dc on oral abx based on sensitivities for a total 10 days post drainage. Should follow up with surgery and PCP at dc Can  extend abx if needed as otpt if infection not resolved  ID will sign off. ID pharmacist Amalia Hailey will recommend abx based on culture results Please call with questions.   Mick Sell   05/13/2021, 1:27 PM

## 2021-05-13 NOTE — Anesthesia Procedure Notes (Signed)
Procedure Name: Intubation Date/Time: 05/13/2021 1:50 PM Performed by: Henrietta Hoover, CRNA Pre-anesthesia Checklist: Patient identified, Patient being monitored, Timeout performed, Emergency Drugs available and Suction available Patient Re-evaluated:Patient Re-evaluated prior to induction Oxygen Delivery Method: Circle system utilized Preoxygenation: Pre-oxygenation with 100% oxygen Induction Type: IV induction Ventilation: Mask ventilation without difficulty Laryngoscope Size: 3 and McGraph Grade View: Grade I Tube type: Oral Tube size: 7.0 mm Number of attempts: 1 Airway Equipment and Method: Stylet Placement Confirmation: ETT inserted through vocal cords under direct vision,  positive ETCO2 and breath sounds checked- equal and bilateral Secured at: 21 cm Tube secured with: Tape Dental Injury: Teeth and Oropharynx as per pre-operative assessment

## 2021-05-13 NOTE — Progress Notes (Signed)
[facility] SURGICAL PROGRESS NOTE  Hospital Day(s): 4.   Interval History:  Patient seen and examined No acute events or new complaints overnight.  Patient resting comfortably in bed, still with back pain and drainage Remains without leukocytosis Remaining labs are reassuring Plan for I&D in the OR this afternoon with Dr Aleen Campi   Vital signs in last 24 hours: [min-max] current  Temp:  [98.4 F (36.9 C)-99.4 F (37.4 C)] 98.8 F (37.1 C) (05/19 0407) Pulse Rate:  [88-102] 93 (05/19 0407) Resp:  [16-18] 16 (05/19 0407) BP: (116-134)/(75-90) 125/81 (05/19 0407) SpO2:  [94 %-99 %] 96 % (05/19 0407)     Height: 5\' 8"  (172.7 cm) Weight: 99.3 kg BMI (Calculated): 33.31   Intake/Output last 2 shifts:  05/18 0701 - 05/19 0700 In: 1200 [P.O.:1200] Out: -    Physical Exam:  Constitutional: alert, cooperative and no distress  Respiratory: breathing non-labored at rest  Cardiovascular: regular rate and sinus rhythm  Integumentary: Mid back abscess  Labs:  CBC Latest Ref Rng & Units 05/13/2021 05/12/2021 05/11/2021  WBC 4.0 - 10.5 K/uL 7.2 9.5 11.1(H)  Hemoglobin 12.0 - 15.0 g/dL 05/13/2021) 10.9(L) 10.6(L)  Hematocrit 36.0 - 46.0 % 30.4(L) 33.5(L) 32.3(L)  Platelets 150 - 400 K/uL 268 248 189   CMP Latest Ref Rng & Units 05/13/2021 05/12/2021 05/11/2021  Glucose 70 - 99 mg/dL 05/13/2021) 716(R) 678(L)  BUN 6 - 20 mg/dL 19 13 12   Creatinine 0.44 - 1.00 mg/dL 381(O 1.75  Sodium 135 - 145 mmol/L 137 133(L) 133(L)  Potassium 3.5 - 5.1 mmol/L 4.1 4.2 3.9  Chloride 98 - 111 mmol/L 101 99 100  CO2 22 - 32 mmol/L 27 27 26   Calcium 8.9 - 10.3 mg/dL 1.02) 9.3 5.85)  Total Protein 6.5 - 8.1 g/dL - - -  Total Bilirubin 0.3 - 1.2 mg/dL - - -  Alkaline Phos 38 - 126 U/L - - -  AST 15 - 41 U/L - - -  ALT 0 - 44 U/L - - -     Imaging studies: No new pertinent imaging studies   Assessment/Plan: (ICD-10's: L51.312) 50 y.o. female with mid-back cellulitis with likely underlying  abscess              - Plan on I&D in the OR with Dr 8.2(U this afternoon pending OR/asnesthesia availability             - All risks, benefits, and alternatives to above procedure(s) were discussed with the patient, all of her questions were answered to her expressed satisfaction, patient expresses she wishes to proceed, and informed consent was obtained.             - NPO + IVF resuscitation             - continue IV Abx (Vancomycin + doxycycline); ID on board; follow up Cx             - Pain control prn             - Further management per primary service; we will follow    All of the above findings and recommendations were discussed with the patient, and all of patient's questions were answered to her expressed satisfaction.  -- L141.120, PA-C [facility] 05/13/2021, 7:42 AM [phone] M-F: 7am - 4pm

## 2021-05-13 NOTE — Progress Notes (Signed)
Inpatient Diabetes Program Recommendations  AACE/ADA: New Consensus Statement on Inpatient Glycemic Control (2015)  Target Ranges:  Prepandial:   less than 140 mg/dL      Peak postprandial:   less than 180 mg/dL (1-2 hours)      Critically ill patients:  140 - 180 mg/dL   Lab Results  Component Value Date   GLUCAP 259 (H) 05/13/2021   HGBA1C 8.2 (H) 05/09/2021    Review of Glycemic Control Results for Ashley Snow, Ashley Snow (MRN [id number]) as of 05/13/2021 12:25  Ref. Range 05/12/2021 08:01 05/12/2021 11:37 05/12/2021 15:56 05/13/2021 08:26 05/13/2021 12:13  Glucose-Capillary Latest Ref Range: 70 - 99 mg/dL 426 (H) 834 (H) 196 (H) 304 (H) 259 (H)   Diabetes history: DM 2 Outpatient Diabetes medications: 70/30 30 units daily with supper, Metformin 1000 mg BID Current orders for Inpatient glycemic control: Levemir 20 units BID, Novolog 3 units TID with meals, Novolog 0-20 units TID with meals, Novolog 0-5 units QHS  Inpatient Diabetes Program Recommendations:    Consider increasing Levemir to 25 units bid.  Also consider increasing Novolog meal coverage to 6 units tid with meals once diet restarted.  Will follow.   Thanks  Beryl Meager, RN, BC-ADM Inpatient Diabetes Coordinator Pager [phone] (8a-5p)

## 2021-05-13 NOTE — Transfer of Care (Signed)
Immediate Anesthesia Transfer of Care Note  Patient: Ashley Snow  Procedure(s) Performed: INCISION AND DRAINAGE ABSCESS (N/A )  Patient Location: PACU  Anesthesia Type:General  Level of Consciousness: awake, alert  and oriented  Airway & Oxygen Therapy: Patient Spontanous Breathing and Patient connected to face mask oxygen  Post-op Assessment: Report given to RN and Post -op Vital signs reviewed and stable  Post vital signs: Reviewed and stable  Last Vitals:  Vitals Value Taken Time  BP 141/85 05/13/21 1506  Temp    Pulse 94 05/13/21 1512  Resp 23 05/13/21 1512  SpO2 98 % 05/13/21 1512  Vitals shown include unvalidated device data.  Last Pain:  Vitals:   05/13/21 1232  TempSrc: Oral  PainSc: 0-No pain      Patients Stated Pain Goal: 1 (05/12/21 2233)  Complications: No complications documented.

## 2021-05-13 NOTE — Op Note (Signed)
  Procedure Date:  05/13/2021  Pre-operative Diagnosis:  Upper back abscess  Post-operative Diagnosis:  Upper back abscess  Procedure:   1.  Limited ultrasound of the upper back. 2.  Incision and Drainage of upper back abscess  Surgeon:  Howie Ill, MD  Anesthesia:  General endotracheal  Estimated Blood Loss:  10 ml  Specimens:  Culture swab  Complications:  None  Indications for Procedure:  This is a 50 y.o. female with diagnosis of upper back abscess, requiring drainage procedure.  The risks of bleeding, abscess or infection, injury to surrounding structures, and need for further procedures were all discussed with the patient and was willing to proceed.  Description of Procedure: The patient was correctly identified in the preoperative area and brought into the operating room.  The patient was placed supine with VTE prophylaxis in place.  Appropriate time-outs were performed.  Anesthesia was induced and the patient was intubated.  Appropriate antibiotics were infused.  The patient was then placed in prone position.  I started with an ultrasound at bedside of the upper back, in the area of the abscess, to measure it's exact dimensions and location.  The abscess including surrounding induration measured about 15 cm x 10 cm.  The patient's upper back was prepped and draped in usual sterile fashion.  A 2 cm incision was made over the lateral aspect of the abscess, revealing purulent fluid.  This fluid was swabbed for culture and sent to micro.  Yankauer was used to suction purulent fluid from the entire cavity.  Two additional incisions were made over the medial aspect and inferior aspect of the abscess cavity to allow for better drainage.  Small Kelly forceps were used to dissect around the abscess tissue to open any remaining pockets of purulent fluid.  After drainage was completed, the cavity was irrigated and cleaned.  50 ml of Exparel solution mixed with 0.5% bupivacaine with epi  was infiltrated onto the subcutaneous tissue and skin.  1/4 inch penrose drains were then looped between the incisions, and tied together usin 0 Silk ties.  The area was cleaned and dressed with 4x4 gauze, ABD pad, and tape.  The patient was then placed back in supine position, emerged from anesthesia, extubated, and brought to the recovery room for further management.  The patient tolerated the procedure well and all counts were correct at the end of the case.   Howie Ill, MD

## 2021-05-14 ENCOUNTER — Other Ambulatory Visit: Payer: Self-pay

## 2021-05-14 ENCOUNTER — Encounter: Payer: Self-pay | Admitting: Surgery

## 2021-05-14 DIAGNOSIS — L03312 Cellulitis of back [any part except buttock]: Secondary | ICD-10-CM

## 2021-05-14 LAB — GLUCOSE, CAPILLARY
Glucose-Capillary: 309 mg/dL — ABNORMAL HIGH (ref 70–99)
Glucose-Capillary: 239 mg/dL — ABNORMAL HIGH (ref 70–99)
Glucose-Capillary: 232 mg/dL — ABNORMAL HIGH (ref 70–99)
Glucose-Capillary: 275 mg/dL — ABNORMAL HIGH (ref 70–99)

## 2021-05-14 LAB — CULTURE, BLOOD (SINGLE): Culture: NO GROWTH

## 2021-05-14 LAB — CBC
HCT: 27.7 % — ABNORMAL LOW (ref 36.0–46.0)
Hemoglobin: 9 g/dL — ABNORMAL LOW (ref 12.0–15.0)
MCH: 26.2 pg (ref 26.0–34.0)
MCHC: 32.5 g/dL (ref 30.0–36.0)
MCV: 80.8 fL (ref 80.0–100.0)
Platelets: 251 10*3/uL (ref 150–400)
RBC: 3.43 MIL/uL — ABNORMAL LOW (ref 3.87–5.11)
RDW: 13.5 % (ref 11.5–15.5)
WBC: 5.8 10*3/uL (ref 4.0–10.5)
nRBC: 0 % (ref 0.0–0.2)

## 2021-05-14 LAB — AEROBIC CULTURE W GRAM STAIN (SUPERFICIAL SPECIMEN)

## 2021-05-14 LAB — BASIC METABOLIC PANEL
Anion gap: 7 (ref 5–15)
BUN: 15 mg/dL (ref 6–20)
CO2: 25 mmol/L (ref 22–32)
Calcium: 8.9 mg/dL (ref 8.9–10.3)
Chloride: 105 mmol/L (ref 98–111)
Creatinine, Ser: 0.73 mg/dL (ref 0.44–1.00)
GFR, Estimated: >60 mL/min (ref >60)
Glucose, Bld: 361 mg/dL — ABNORMAL HIGH (ref 70–99)
Potassium: 4.1 mmol/L (ref 3.5–5.1)
Sodium: 137 mmol/L (ref 135–145)

## 2021-05-14 LAB — MAGNESIUM: Magnesium: 1.7 mg/dL (ref 1.7–2.4)

## 2021-05-14 MED ORDER — MAGNESIUM OXIDE -MG SUPPLEMENT 400 (240 MG) MG PO TABS
200.0000 mg | ORAL_TABLET | Freq: Two times a day (BID) | ORAL | 0 refills | Status: AC
  Filled 2021-05-14: qty 30, 30d supply, fill #0

## 2021-05-14 MED ORDER — INSULIN ASPART 100 UNIT/ML IJ SOLN
5.0000 [IU] | Freq: Three times a day (TID) | INTRAMUSCULAR | Status: DC
  Administered 2021-05-14 (×3): 5 [IU] via SUBCUTANEOUS
  Filled 2021-05-14 (×3): qty 1

## 2021-05-14 MED ORDER — CYANOCOBALAMIN 100 MCG PO TABS
100.0000 ug | ORAL_TABLET | Freq: Every day | ORAL | 0 refills | Status: AC
  Filled 2021-05-14: qty 30, 30d supply, fill #0

## 2021-05-14 MED ORDER — "BD VEO INSULIN SYRINGE U/F 31G X 15/64"" 1 ML MISC"
1 refills | Status: AC
  Filled 2021-05-14: qty 60, 30d supply, fill #0

## 2021-05-14 MED ORDER — NICOTINE 21 MG/24HR TD PT24
21.0000 mg | MEDICATED_PATCH | Freq: Every day | TRANSDERMAL | 0 refills | Status: AC
  Filled 2021-05-14: qty 28, 28d supply, fill #0

## 2021-05-14 MED ORDER — DOXYCYCLINE HYCLATE 100 MG PO TABS
100.0000 mg | ORAL_TABLET | Freq: Two times a day (BID) | ORAL | 0 refills | Status: AC
  Filled 2021-05-14: qty 18, 9d supply, fill #0

## 2021-05-14 MED ORDER — INSULIN NPH ISOPHANE & REGULAR (70-30) 100 UNIT/ML ~~LOC~~ SUSP
35.0000 [IU] | Freq: Two times a day (BID) | SUBCUTANEOUS | 11 refills | Status: AC
  Filled 2021-05-14: qty 20, 29d supply, fill #0

## 2021-05-14 MED ORDER — DOXYCYCLINE HYCLATE 100 MG PO TABS
100.0000 mg | ORAL_TABLET | Freq: Two times a day (BID) | ORAL | Status: DC
  Administered 2021-05-14 – 2021-05-17 (×7): 100 mg via ORAL
  Filled 2021-05-14 (×7): qty 1

## 2021-05-14 MED ORDER — POLYETHYLENE GLYCOL 3350 17 GM/SCOOP PO POWD
1.0000 | Freq: Every day | ORAL | 0 refills | Status: AC
  Filled 2021-05-14: qty 255, 1d supply, fill #0

## 2021-05-14 MED ORDER — ATORVASTATIN CALCIUM 80 MG PO TABS
80.0000 mg | ORAL_TABLET | Freq: Every day | ORAL | 1 refills | Status: AC
  Filled 2021-05-14: qty 30, 30d supply, fill #0

## 2021-05-14 MED ORDER — GABAPENTIN 300 MG PO CAPS
300.0000 mg | ORAL_CAPSULE | Freq: Three times a day (TID) | ORAL | 2 refills | Status: AC
  Filled 2021-05-14: qty 60, 20d supply, fill #0

## 2021-05-14 MED ORDER — OXYCODONE HCL 5 MG PO TABS: 5.0000 mg | ORAL_TABLET | Freq: Four times a day (QID) | ORAL | 0 refills | Status: DC | PRN

## 2021-05-14 MED ORDER — METFORMIN HCL 1000 MG PO TABS
1000.0000 mg | ORAL_TABLET | Freq: Two times a day (BID) | ORAL | 1 refills | Status: AC
Start: 2021-05-14 — End: ?
  Filled 2021-05-14: qty 30, 15d supply, fill #0

## 2021-05-14 NOTE — Anesthesia Postprocedure Evaluation (Signed)
Anesthesia Post Note  Patient: Ashley Snow  Procedure(s) Performed: INCISION AND DRAINAGE ABSCESS (N/A )  Patient location during evaluation: PACU Anesthesia Type: General Level of consciousness: awake and alert and oriented Pain management: pain level controlled Vital Signs Assessment: post-procedure vital signs reviewed and stable Respiratory status: spontaneous breathing Cardiovascular status: blood pressure returned to baseline Anesthetic complications: no   No complications documented.   Last Vitals:  Vitals:   05/14/21 0751 05/14/21 1144  BP: (!) 141/88 (!) 146/90  Pulse: 85 89  Resp: 16 16  Temp: 37.1 C 37.1 C  SpO2: 97% 99%    Last Pain:  Vitals:   05/14/21 1155  TempSrc:   PainSc: 7                  Kenosha Doster

## 2021-05-14 NOTE — TOC Progression Note (Signed)
Transition of Care [facility]) - Progression Note    Patient Details  Name: Ashley Snow MRN: [id number] Date of Birth: 01-11-1971  Transition of Care [facility]) CM/SW Contact  Allayne Butcher, RN Phone Number: 05/14/2021, 4:01 PM  Clinical Narrative:    [facility] and delivered to the floor.  Bedside RN is aware that meds delivered and they will be placed in patient bin in medication room.     Expected Discharge Plan: Home/Self Care Barriers to Discharge: Continued Medical Work up  Expected Discharge Plan and Services Expected Discharge Plan: Home/Self Care   Discharge Planning Services: CM Consult,Medication Assistance,Indigent Health Clinic   Living arrangements for the past 2 months: Boarding House                 DME Arranged: N/A DME Agency: NA       HH Arranged: NA           Social Determinants of Health (SDOH) Interventions    Readmission Risk Interventions No flowsheet data found.

## 2021-05-14 NOTE — Discharge Instructions (Signed)
In addition to included general post-operative instructions,  Wound care: You have two penrose drains in place. This will stay in place and be removed during your follow up appointments (typically starting in ~1 week). Cover wound with dry guaze and ABD pad and secure. This should be changed daily. It is okay to shower. Prior to showering, removing dressing. Do not submerge or scrub wound. Pat dry and replace dressing.   Call office [phone] / [phone]) at any time if any questions, worsening pain, fevers/chills, bleeding, drainage from incision site, or other concerns.

## 2021-05-14 NOTE — Progress Notes (Signed)
Inpatient Diabetes Program Recommendations  AACE/ADA: New Consensus Statement on Inpatient Glycemic Control (2015)  Target Ranges:  Prepandial:   less than 140 mg/dL      Peak postprandial:   less than 180 mg/dL (1-2 hours)      Critically ill patients:  140 - 180 mg/dL   Lab Results  Component Value Date   GLUCAP 309 (H) 05/14/2021   HGBA1C 8.2 (H) 05/09/2021    Review of Glycemic Control Results for Ashley Snow, Ashley Snow (MRN [id number]) as of 05/14/2021 11:20  Ref. Range 05/13/2021 12:13 05/13/2021 15:09 05/13/2021 17:35 05/13/2021 21:04 05/14/2021 07:52  Glucose-Capillary Latest Ref Range: 70 - 99 mg/dL 701 (H) 779 (H) 390 (H) 317 (H) 309 (H)  Diabetes history: DM 2 Outpatient Diabetes medications:70/30 30 units daily with supper, Metformin 1000 mg BID Current orders for Inpatient glycemic control:Levemir 25 units BID, Novolog 5 units TID with meals, Novolog 0-20 units TID with meals, Novolog 0-5 units QHS Inpatient Diabetes Program Recommendations:    At d/c patient will likely do better with 70/30 since it only requires 2 shots per day.  Prior to coming in, she was only taking one injection with supper due to not always eating breakfast.  Ideally, she needs to take 70/30 bid with breakfast and supper in order to get full 24 hour coverage.  At d/c consider 70/30 25 units bid with breakfast and Supper?    Thanks,  Beryl Meager, RN, BC-ADM Inpatient Diabetes Coordinator Pager [phone] (8a-5p)

## 2021-05-14 NOTE — Progress Notes (Signed)
[facility] SURGICAL PROGRESS NOTE  Hospital Day(s): 5.   Post op day(s): 1 Day Post-Op.   Interval History:  Patient seen and examined No acute events or new complaints overnight.  Patient reports she is feeling better overall, still with soreness to the I&D site in mid back No fever, chills She continues to remain without leukocytosis; now 5.8K Still hyperglycemic but otherwise labs are reassuring Cx from OR (05/19) remain pending Initial CX (05/17) growing staph aureus; susceptibilities pending She continues on IV Vancomycin  Vital signs in last 24 hours: [min-max] current  Temp:  [97.9 F (36.6 C)-99.6 F (37.6 C)] 98 F (36.7 C) (05/20 0327) Pulse Rate:  [75-103] 75 (05/20 0327) Resp:  [15-21] 16 (05/20 0327) BP: (124-159)/(79-99) 126/79 (05/20 0327) SpO2:  [92 %-99 %] 95 % (05/20 0327)     Height: 5\' 8"  (172.7 cm) Weight: 99.3 kg BMI (Calculated): 33.31   Intake/Output last 2 shifts:  05/19 0701 - 05/20 0700 In: 1670 [P.O.:720; I.V.:750; IV Piggyback:200] Out: 5 [Blood:5]   Physical Exam:  Constitutional: alert, cooperative and no distress  Respiratory: breathing non-labored at rest  Cardiovascular: regular rate and sinus rhythm  Integumentary: I&D to mid back with penrose drains x2 in place, erythema is improved, this remains indurated  Labs:  CBC Latest Ref Rng & Units 05/13/2021 05/12/2021 05/11/2021  WBC 4.0 - 10.5 K/uL 7.2 9.5 11.1(H)  Hemoglobin 12.0 - 15.0 g/dL 05/13/2021) 10.9(L) 10.6(L)  Hematocrit 36.0 - 46.0 % 30.4(L) 33.5(L) 32.3(L)  Platelets 150 - 400 K/uL 268 248 189   CMP Latest Ref Rng & Units 05/13/2021 05/12/2021 05/11/2021  Glucose 70 - 99 mg/dL 05/13/2021) 235(T) 614(E)  BUN 6 - 20 mg/dL 19 13 12   Creatinine 0.44 - 1.00 mg/dL 315(Q 0.08  Sodium 135 - 145 mmol/L 137 133(L) 133(L)  Potassium 3.5 - 5.1 mmol/L 4.1 4.2 3.9  Chloride 98 - 111 mmol/L 101 99 100  CO2 22 - 32 mmol/L 27 27 26   Calcium 8.9 - 10.3 mg/dL 6.76) 9.3 1.95)   Total Protein 6.5 - 8.1 g/dL - - -  Total Bilirubin 0.3 - 1.2 mg/dL - - -  Alkaline Phos 38 - 126 U/L - - -  AST 15 - 41 U/L - - -  ALT 0 - 44 U/L - - -    Imaging studies: No new pertinent imaging studies   Assessment/Plan:  50 y.o. female 1 Day Post-Op s/p incision and drainage for back abscess.   - Follow up initial Cx from 05/17; susceptibilities pending  - Continue IV Abx; Recommend completing 10 days PO from date of surgery  - Wound care: Superficial dressings with gauze, ABD pad and tape, daily and PRN   - Pain control prn   - Further management per primary service; we will sign off. I will update discharge recommendation from surgical standpoint    - Discharge Planning; Okay for discharge from surgical standpoint, recommend PO Abx x10 days with MRSA coverage, follow up Cx, follow up in surgery clinic in ~1 week   All of the above findings and recommendations were discussed with the patient, and the medical team, and all of patient's questions were answered to her expressed satisfaction.  -- 2.6(Z, PA-C [facility] 05/14/2021, 6:51 AM [phone] M-F: 7am - 4pm

## 2021-05-14 NOTE — Progress Notes (Signed)
PROGRESS NOTE    Ashley Snow  HBZ:[id number] DOB: 10/24/1971 DOA: 05/09/2021 PCP: Patient, No Pcp Per (Inactive)   Brief Narrative: 49 past medical history significant for insulin-dependent diabetes, obesity, hypertension, nicotine dependence who resides in a boardinghouse.  She presents to the ER for evaluation of pain, redness and swelling in her left upper back.  She has been having issues with bedbugs.  4 days prior to admission she noticed a bump in her left upper back that was pruritic.  She report fevers and chills, temperature 101    Assessment & Plan:   Principal Problem:   Cellulitis Active Problems:   Hypertension   Diabetes mellitus with hyperglycemia (HCC)   Nicotine dependence   Abscess of back  1-Cellulitis/Abscess  of upper back, present on admission, sepsis present on admission -Patient presented with fever 101, tachycardia, tachypnea and leukocytosis -Was initially treated with Ancef.  Currently on vancomycin -She is  on doxycycline to cover for tickborne diseases. -ID consulted. Had pus drain  From abscess, culture growing Staph Aureus sensitive to Doxy  -resume tramadol for Pain PRN.  -Burgdorfi test was discontinue ,  RMSF positive and Ehrlichia negative. On Doxy  -General Sx consulted.underwent I and D> 5/20. -Discussed with ID plan to treat with Doxy for at least 7 days for RMSF.    2-Diabetes with hyperglycemia: She will need assistance with medication Increase Levemir to 25 units , increase meal coverage to 5 units.    3-Peripheral neuropathy: Continue with gabapentin  4-Current smoker: Continue with nicotine patch  5-Obesity: Need lifestyle modification  6-Hyperlipidemia:  continue with statins Hypomagnesemia; Replaced.  Anemia; monitor. Iron deficiency. She will need iron when infection is controlled. Will start B 12 supplement.  Mild hyponatremia; IV fluids.   Plan to continue to adjust insulin regimen. Plan for discharge tomorrow.    Estimated body mass index is 33.3 kg/m as calculated from the following:   Height as of this encounter: 5\' 8"  (1.727 m).   Weight as of this encounter: 99.3 kg.   DVT prophylaxis: Lovenox Code Status: Full code Family Communication: care discussed with patient.  Disposition Plan:  Status is: Inpatient  Remains inpatient appropriate because:IV treatments appropriate due to intensity of illness or inability to take PO   Dispo: The patient is from: Home              Anticipated d/c is to: Home              Patient currently is not medically stable to d/c.   Difficult to place patient No        Consultants:   ID  Procedures:   None  Antimicrobials:  Vancomycin   Subjective: She report back pain.   Objective: Vitals:   05/13/21 2314 05/14/21 0327 05/14/21 0751 05/14/21 1144  BP: 138/86 126/79 (!) 141/88 (!) 146/90  Pulse: 88 75 85 89  Resp: 16 16 16 16   Temp: 98.2 F (36.8 C) 98 F (36.7 C) 98.7 F (37.1 C) 98.7 F (37.1 C)  TempSrc: Oral Oral Oral Oral  SpO2: 97% 95% 97% 99%  Weight:      Height:        Intake/Output Summary (Last 24 hours) at 05/14/2021 1315 Last data filed at 05/14/2021 1010 Gross per 24 hour  Intake 1910 ml  Output 5 ml  Net 1905 ml   Filed Weights   05/09/21 0730  Weight: 99.3 kg    Examination:  General exam: NAD Respiratory system: CTA  Cardiovascular system: S 1, S 2 RRR Gastrointestinal system: BS present, soft, nt Central nervous system: Non focal.  Extremities: no edema Skin: clean dressing on the back    Data Reviewed: I have personally reviewed following labs and imaging studies  CBC: Recent Labs  Lab 05/09/21 0734 05/10/21 0418 05/11/21 0439 05/12/21 0400 05/13/21 0356 05/14/21 0632  WBC 16.5* 15.3* 11.1* 9.5 7.2 5.8  NEUTROABS 13.7*  --   --   --   --   --   HGB 12.1 10.7* 10.6* 10.9* 9.8* 9.0*  HCT 37.3 32.5* 32.3* 33.5* 30.4* 27.7*  MCV 80.9 79.3* 79.6* 80.5 80.6 80.8  PLT 248 190 189 248 268  251   Basic Metabolic Panel: Recent Labs  Lab 05/09/21 0734 05/10/21 0418 05/11/21 0439 05/12/21 0400 05/13/21 0356 05/14/21 0632  NA 134* 132* 133* 133* 137 137  K 4.1 4.0 3.9 4.2 4.1 4.1  CL 100 101 100 99 101 105  CO2 22 24 26 27 27 25   GLUCOSE 404* 289* 306* 307* 379* 361*  BUN 18 10 12 13 19 15   CREATININE 0.71 0.62 0.68 0.63 0.68 0.73  CALCIUM 9.7 8.5* 8.7* 9.3 8.8* 8.9  MG 1.8  --  1.7 1.6* 1.7 1.7   GFR: Estimated Creatinine Clearance: 102.6 mL/min (by C-G formula based on SCr of 0.73 mg/dL). Liver Function Tests: Recent Labs  Lab 05/09/21 0734  AST 12*  ALT 11  ALKPHOS 99  BILITOT 0.3  PROT 7.9  ALBUMIN 4.0   No results for input(s): LIPASE, AMYLASE in the last 168 hours. No results for input(s): AMMONIA in the last 168 hours. Coagulation Profile: Recent Labs  Lab 05/09/21 0734  INR 0.9   Cardiac Enzymes: No results for input(s): CKTOTAL, CKMB, CKMBINDEX, TROPONINI in the last 168 hours. BNP (last 3 results) No results for input(s): PROBNP in the last 8760 hours. HbA1C: No results for input(s): HGBA1C in the last 72 hours. CBG: Recent Labs  Lab 05/13/21 1509 05/13/21 1735 05/13/21 2104 05/14/21 0752 05/14/21 1145  GLUCAP 163* 174* 317* 309* 239*   Lipid Profile: No results for input(s): CHOL, HDL, LDLCALC, TRIG, CHOLHDL, LDLDIRECT in the last 72 hours. Thyroid Function Tests: No results for input(s): TSH, T4TOTAL, FREET4, T3FREE, THYROIDAB in the last 72 hours. Anemia Panel: Recent Labs    05/13/21 0356  VITAMINB12 254  FOLATE 9.6  FERRITIN 92  TIBC 294  IRON 18*  RETICCTPCT 1.2   Sepsis Labs: Recent Labs  Lab 05/09/21 0734 05/09/21 0931  PROCALCITON <0.10  --   LATICACIDVEN 1.4 1.5    Recent Results (from the past 240 hour(s))  Blood culture (routine single)     Status: None   Collection Time: 05/09/21  7:34 AM   Specimen: BLOOD RIGHT HAND  Result Value Ref Range Status   Specimen Description BLOOD RIGHT HAND  Final    Special Requests   Final    BOTTLES DRAWN AEROBIC AND ANAEROBIC Blood Culture results may not be optimal due to an inadequate volume of blood received in culture bottles   Culture   Final    NO GROWTH 5 DAYS Performed at [facility], [address] [place], [address] [place]    Report Status 05/14/2021 FINAL  Final  Resp Panel by RT-PCR (Flu A&B, Covid) Nasopharyngeal Swab     Status: None   Collection Time: 05/09/21  7:58 AM   Specimen: Nasopharyngeal Swab; Nasopharyngeal(NP) swabs in vial transport medium  Result Value Ref Range Status  SARS Coronavirus 2 by RT PCR NEGATIVE NEGATIVE Final    Comment: (NOTE) SARS-CoV-2 target nucleic acids are NOT DETECTED.  The SARS-CoV-2 RNA is generally detectable in upper respiratory specimens during the acute phase of infection. The lowest concentration of SARS-CoV-2 viral copies this assay can detect is 138 copies/mL. A negative result does not preclude SARS-Cov-2 infection and should not be used as the sole basis for treatment or other patient management decisions. A negative result may occur with  improper specimen collection/handling, submission of specimen other than nasopharyngeal swab, presence of viral mutation(s) within the areas targeted by this assay, and inadequate number of viral copies(<138 copies/mL). A negative result must be combined with clinical observations, patient history, and epidemiological information. The expected result is Negative.  Fact Sheet for Patients:  BloggerCourse.com  Fact Sheet for Healthcare Providers:  SeriousBroker.it  This test is no t yet approved or cleared by the [place] FDA and  has been authorized for detection and/or diagnosis of SARS-CoV-2 by FDA under an Emergency Use Authorization (EUA). This EUA will remain  in effect (meaning this test can be used) for the duration of the COVID-19 declaration under Section 564(b)(1) of  the Act, 21 U.S.C.section 360bbb-3(b)(1), unless the authorization is terminated  or revoked sooner.       Influenza A by PCR NEGATIVE NEGATIVE Final   Influenza B by PCR NEGATIVE NEGATIVE Final    Comment: (NOTE) The Xpert Xpress SARS-CoV-2/FLU/RSV plus assay is intended as an aid in the diagnosis of influenza from Nasopharyngeal swab specimens and should not be used as a sole basis for treatment. Nasal washings and aspirates are unacceptable for Xpert Xpress SARS-CoV-2/FLU/RSV testing.  Fact Sheet for Patients: BloggerCourse.com  Fact Sheet for Healthcare Providers: SeriousBroker.it  This test is not yet approved or cleared by the [place] FDA and has been authorized for detection and/or diagnosis of SARS-CoV-2 by FDA under an Emergency Use Authorization (EUA). This EUA will remain in effect (meaning this test can be used) for the duration of the COVID-19 declaration under Section 564(b)(1) of the Act, 21 U.S.C. section 360bbb-3(b)(1), unless the authorization is terminated or revoked.  Performed at [facility], [address] [place], [place] [address]   MRSA PCR Screening     Status: Abnormal   Collection Time: 05/09/21 12:07 PM   Specimen: Nasopharyngeal  Result Value Ref Range Status   MRSA by PCR POSITIVE (A) NEGATIVE Final    Comment:        The GeneXpert MRSA Assay (FDA approved for NASAL specimens only), is one component of a comprehensive MRSA colonization surveillance program. It is not intended to diagnose MRSA infection nor to guide or monitor treatment for MRSA infections. RESULT CALLED TO, READ BACK BY AND VERIFIED WITH: A.[email or web address],RN AT 1442 ON 05/09/21 BY GM Performed at [facility], [address] [place], [place] [address]   Aerobic Culture w Gram Stain (superficial specimen)     Status: None   Collection Time: 05/11/21  5:00 PM   Specimen: Wound  Result Value Ref  Range Status   Specimen Description   Final    WOUND Performed at [facility], [address], [place], [place] [address]    Special Requests   Final    BACK Performed at [facility], [address] [place], [place] [address]    Gram Stain   Final    MODERATE WBC PRESENT, PREDOMINANTLY PMN RARE GRAM POSITIVE COCCI IN CLUSTERS Performed at [facility], [address] [place], [place] [id number]    Culture FEW METHICILLIN RESISTANT STAPHYLOCOCCUS AUREUS  Final   Report Status 05/14/2021 FINAL  Final   Organism ID, Bacteria METHICILLIN RESISTANT STAPHYLOCOCCUS AUREUS  Final      Susceptibility   Methicillin resistant staphylococcus aureus - MIC*    CIPROFLOXACIN >=8 RESISTANT Resistant     ERYTHROMYCIN >=8 RESISTANT Resistant     GENTAMICIN <=0.5 SENSITIVE Sensitive     OXACILLIN >=4 RESISTANT Resistant     TETRACYCLINE <=1 SENSITIVE Sensitive     VANCOMYCIN 1 SENSITIVE Sensitive     TRIMETH/SULFA 80 RESISTANT Resistant     CLINDAMYCIN <=0.25 SENSITIVE Sensitive     RIFAMPIN <=0.5 SENSITIVE Sensitive     Inducible Clindamycin NEGATIVE Sensitive     * FEW METHICILLIN RESISTANT STAPHYLOCOCCUS AUREUS         Radiology Studies: No results found.      Scheduled Meds: . atorvastatin  80 mg Oral Daily  . doxycycline  100 mg Oral Q12H  . enoxaparin (LOVENOX) injection  0.5 mg/kg Subcutaneous Q24H  . gabapentin  300 mg Oral TID  . insulin aspart  0-20 Units Subcutaneous TID WC  . insulin aspart  0-5 Units Subcutaneous QHS  . insulin aspart  5 Units Subcutaneous TID WC  . insulin detemir  25 Units Subcutaneous BID  . ketorolac  30 mg Intravenous Q6H  . loratadine  10 mg Oral Daily  . magnesium oxide  200 mg Oral BID  . nicotine  21 mg Transdermal Daily  . senna  1 tablet Oral BID  . vitamin B-12  100 mcg Oral Daily   Continuous Infusions: . sodium chloride 75 mL/hr at 05/14/21 0542     LOS: 5 days    Time spent: 35  minutes    Andra Heslin A Lakyia Behe, MD [facility]   If 7PM-7AM, please contact night-coverage [email or web address]  05/14/2021, 1:15 PM

## 2021-05-15 LAB — CBC
HCT: 29 % — ABNORMAL LOW (ref 36.0–46.0)
Hemoglobin: 9.3 g/dL — ABNORMAL LOW (ref 12.0–15.0)
MCH: 25.7 pg — ABNORMAL LOW (ref 26.0–34.0)
MCHC: 32.1 g/dL (ref 30.0–36.0)
MCV: 80.1 fL (ref 80.0–100.0)
Platelets: 307 10*3/uL (ref 150–400)
RBC: 3.62 MIL/uL — ABNORMAL LOW (ref 3.87–5.11)
RDW: 13.2 % (ref 11.5–15.5)
WBC: 6.5 10*3/uL (ref 4.0–10.5)
nRBC: 0 % (ref 0.0–0.2)

## 2021-05-15 LAB — BASIC METABOLIC PANEL
Anion gap: 6 (ref 5–15)
BUN: 17 mg/dL (ref 6–20)
CO2: 26 mmol/L (ref 22–32)
Calcium: 8.9 mg/dL (ref 8.9–10.3)
Chloride: 105 mmol/L (ref 98–111)
Creatinine, Ser: 0.61 mg/dL (ref 0.44–1.00)
GFR, Estimated: >60 mL/min (ref >60)
Glucose, Bld: 311 mg/dL — ABNORMAL HIGH (ref 70–99)
Potassium: 3.9 mmol/L (ref 3.5–5.1)
Sodium: 137 mmol/L (ref 135–145)

## 2021-05-15 LAB — GLUCOSE, CAPILLARY
Glucose-Capillary: 398 mg/dL — ABNORMAL HIGH (ref 70–99)
Glucose-Capillary: 229 mg/dL — ABNORMAL HIGH (ref 70–99)
Glucose-Capillary: 221 mg/dL — ABNORMAL HIGH (ref 70–99)
Glucose-Capillary: 314 mg/dL — ABNORMAL HIGH (ref 70–99)

## 2021-05-15 LAB — MAGNESIUM: Magnesium: 1.6 mg/dL — ABNORMAL LOW (ref 1.7–2.4)

## 2021-05-15 MED ORDER — MAGNESIUM SULFATE 2 GM/50ML IV SOLN
2.0000 g | Freq: Once | INTRAVENOUS | Status: AC
  Administered 2021-05-15: 2 g via INTRAVENOUS
  Filled 2021-05-15: qty 50

## 2021-05-15 MED ORDER — INSULIN DETEMIR 100 UNIT/ML ~~LOC~~ SOLN
30.0000 [IU] | Freq: Two times a day (BID) | SUBCUTANEOUS | Status: DC
  Administered 2021-05-15 (×2): 30 [IU] via SUBCUTANEOUS
  Filled 2021-05-15 (×3): qty 0.3

## 2021-05-15 MED ORDER — INSULIN ASPART 100 UNIT/ML IJ SOLN
6.0000 [IU] | Freq: Three times a day (TID) | INTRAMUSCULAR | Status: DC
  Administered 2021-05-15 – 2021-05-17 (×8): 6 [IU] via SUBCUTANEOUS
  Filled 2021-05-15 (×7): qty 1

## 2021-05-15 NOTE — Progress Notes (Signed)
PROGRESS NOTE    Efraim KaufmannMelissa Eisemann  ZOX:[id number] DOB: 17-Mar-1971 DOA: 05/09/2021 PCP: Patient, No Pcp Per (Inactive)   Brief Narrative: 49 past medical history significant for insulin-dependent diabetes, obesity, hypertension, nicotine dependence who resides in a boardinghouse.  She presents to the ER for evaluation of pain, redness and swelling in her left upper back.  She has been having issues with bedbugs.  4 days prior to admission she noticed a bump in her left upper back that was pruritic.  She report fevers and chills, temperature 101    Assessment & Plan:   Principal Problem:   Cellulitis Active Problems:   Hypertension   Diabetes mellitus with hyperglycemia (HCC)   Nicotine dependence   Abscess of back  1-Cellulitis/Abscess  of upper back, present on admission, sepsis present on admission -Patient presented with fever 101, tachycardia, tachypnea and leukocytosis -Was initially treated with Ancef.  Currently on vancomycin -She is  on doxycycline to cover for tickborne diseases. -ID consulted. Had pus drain  From abscess, culture growing Staph Aureus sensitive to Doxy  -resume tramadol for Pain PRN.  -Burgdorfi test was discontinue ,  RMSF positive and Ehrlichia negative. On Doxy  -General Sx consulted.underwent I and D> 5/20. -Discussed with ID plan to treat with Doxy for at least 7 days for RMSF. 10 days antibiotics post Sx.  [facility]. Patient has two penrose drains in place. Needs to follow up with Sx 5/27 for possible removal of drains.    2-Diabetes with hyperglycemia: She will need assistance with medication Increase Levemir to 30 units , increase meal coverage to 6 units.    3-Peripheral neuropathy: Continue with gabapentin  4-Current smoker: Continue with nicotine patch  5-Obesity: Need lifestyle modification.  6-Hyperlipidemia:  continue with statins Hypomagnesemia; Replaced.  Anemia; monitor. Iron deficiency. She will  need iron when infection is controlled. Will start B 12 supplement.  Mild hyponatremia; IV fluids.   Plan to continue to adjust insulin regimen. Plan for discharge tomorrow.   Estimated body mass index is 33.3 kg/m as calculated from the following:   Height as of this encounter: 5\' 8"  (1.727 m).   Weight as of this encounter: 99.3 kg.   DVT prophylaxis: Lovenox Code Status: Full code Family Communication: Care discussed with patient.  Disposition Plan:  Status is: Inpatient  Remains inpatient appropriate because:IV treatments appropriate due to intensity of illness or inability to take PO   Dispo: The patient is from: Home              Anticipated d/c is to: Home              Patient currently is not medically stable to d/c.   Difficult to place patient No        Consultants:   ID  Procedures:   None  Antimicrobials:  Vancomycin   Subjective: She is concern with wound care.  She is complaining of back pain.   Objective: Vitals:   05/14/21 2020 05/14/21 2346 05/15/21 0446 05/15/21 0856  BP: (!) 157/85 134/78 132/71 (!) 148/86  Pulse: 91 91 85 86  Resp: 18 18 18 16   Temp: 99.1 F (37.3 C) 98.9 F (37.2 C) 97.9 F (36.6 C) 98.2 F (36.8 C)  TempSrc: Oral Oral  Oral  SpO2: 95% 97% 96% 96%  Weight:      Height:        Intake/Output Summary (Last 24 hours) at 05/15/2021 1350 Last data filed  at 05/14/2021 2300 Gross per 24 hour  Intake 720 ml  Output --  Net 720 ml   Filed Weights   05/09/21 0730  Weight: 99.3 kg    Examination:  General exam: NAD Respiratory system: CTA Cardiovascular system: S ,1 S 2 RRR Gastrointestinal system: BS present, soft, nt Central nervous system: Non focal  Extremities: no edema Skin: clean dressing on the back    Data Reviewed: I have personally reviewed following labs and imaging studies  CBC: Recent Labs  Lab 05/09/21 0734 05/10/21 0418 05/11/21 0439 05/12/21 0400 05/13/21 0356 05/14/21 0632  05/15/21 0506  WBC 16.5*   < > 11.1* 9.5 7.2 5.8 6.5  NEUTROABS 13.7*  --   --   --   --   --   --   HGB 12.1   < > 10.6* 10.9* 9.8* 9.0* 9.3*  HCT 37.3   < > 32.3* 33.5* 30.4* 27.7* 29.0*  MCV 80.9   < > 79.6* 80.5 80.6 80.8 80.1  PLT 248   < > 189 248 268 251 307   < > = values in this interval not displayed.   Basic Metabolic Panel: Recent Labs  Lab 05/11/21 0439 05/12/21 0400 05/13/21 0356 05/14/21 0632 05/15/21 0506  NA 133* 133* 137 137 137  K 3.9 4.2 4.1 4.1 3.9  CL 100 99 101 105 105  CO2 26 27 27 25 26   GLUCOSE 306* 307* 379* 361* 311*  BUN 12 13 19 15 17   CREATININE 0.68 0.63 0.68 0.73 0.61  CALCIUM 8.7* 9.3 8.8* 8.9 8.9  MG 1.7 1.6* 1.7 1.7 1.6*   GFR: Estimated Creatinine Clearance: 102.6 mL/min (by C-G formula based on SCr of 0.61 mg/dL). Liver Function Tests: Recent Labs  Lab 05/09/21 0734  AST 12*  ALT 11  ALKPHOS 99  BILITOT 0.3  PROT 7.9  ALBUMIN 4.0   No results for input(s): LIPASE, AMYLASE in the last 168 hours. No results for input(s): AMMONIA in the last 168 hours. Coagulation Profile: Recent Labs  Lab 05/09/21 0734  INR 0.9   Cardiac Enzymes: No results for input(s): CKTOTAL, CKMB, CKMBINDEX, TROPONINI in the last 168 hours. BNP (last 3 results) No results for input(s): PROBNP in the last 8760 hours. HbA1C: No results for input(s): HGBA1C in the last 72 hours. CBG: Recent Labs  Lab 05/14/21 1145 05/14/21 1659 05/14/21 2143 05/15/21 0854 05/15/21 1136  GLUCAP 239* 232* 275* 398* 229*   Lipid Profile: No results for input(s): CHOL, HDL, LDLCALC, TRIG, CHOLHDL, LDLDIRECT in the last 72 hours. Thyroid Function Tests: No results for input(s): TSH, T4TOTAL, FREET4, T3FREE, THYROIDAB in the last 72 hours. Anemia Panel: Recent Labs    05/13/21 0356  VITAMINB12 254  FOLATE 9.6  FERRITIN 92  TIBC 294  IRON 18*  RETICCTPCT 1.2   Sepsis Labs: Recent Labs  Lab 05/09/21 0734 05/09/21 0931  PROCALCITON <0.10  --    LATICACIDVEN 1.4 1.5    Recent Results (from the past 240 hour(s))  Blood culture (routine single)     Status: None   Collection Time: 05/09/21  7:34 AM   Specimen: BLOOD RIGHT HAND  Result Value Ref Range Status   Specimen Description BLOOD RIGHT HAND  Final   Special Requests   Final    BOTTLES DRAWN AEROBIC AND ANAEROBIC Blood Culture results may not be optimal due to an inadequate volume of blood received in culture bottles   Culture   Final    NO GROWTH  5 DAYS Performed at [facility], [address] [place], [place] [address]    Report Status 05/14/2021 FINAL  Final  Resp Panel by RT-PCR (Flu A&B, Covid) Nasopharyngeal Swab     Status: None   Collection Time: 05/09/21  7:58 AM   Specimen: Nasopharyngeal Swab; Nasopharyngeal(NP) swabs in vial transport medium  Result Value Ref Range Status   SARS Coronavirus 2 by RT PCR NEGATIVE NEGATIVE Final    Comment: (NOTE) SARS-CoV-2 target nucleic acids are NOT DETECTED.  The SARS-CoV-2 RNA is generally detectable in upper respiratory specimens during the acute phase of infection. The lowest concentration of SARS-CoV-2 viral copies this assay can detect is 138 copies/mL. A negative result does not preclude SARS-Cov-2 infection and should not be used as the sole basis for treatment or other patient management decisions. A negative result may occur with  improper specimen collection/handling, submission of specimen other than nasopharyngeal swab, presence of viral mutation(s) within the areas targeted by this assay, and inadequate number of viral copies(<138 copies/mL). A negative result must be combined with clinical observations, patient history, and epidemiological information. The expected result is Negative.  Fact Sheet for Patients:  BloggerCourse.com  Fact Sheet for Healthcare Providers:  SeriousBroker.it  This test is no t yet approved or cleared by the [place] FDA and  has been authorized for detection and/or diagnosis of SARS-CoV-2 by FDA under an Emergency Use Authorization (EUA). This EUA will remain  in effect (meaning this test can be used) for the duration of the COVID-19 declaration under Section 564(b)(1) of the Act, 21 U.S.C.section 360bbb-3(b)(1), unless the authorization is terminated  or revoked sooner.       Influenza A by PCR NEGATIVE NEGATIVE Final   Influenza B by PCR NEGATIVE NEGATIVE Final    Comment: (NOTE) The Xpert Xpress SARS-CoV-2/FLU/RSV plus assay is intended as an aid in the diagnosis of influenza from Nasopharyngeal swab specimens and should not be used as a sole basis for treatment. Nasal washings and aspirates are unacceptable for Xpert Xpress SARS-CoV-2/FLU/RSV testing.  Fact Sheet for Patients: BloggerCourse.com  Fact Sheet for Healthcare Providers: SeriousBroker.it  This test is not yet approved or cleared by the [place] FDA and has been authorized for detection and/or diagnosis of SARS-CoV-2 by FDA under an Emergency Use Authorization (EUA). This EUA will remain in effect (meaning this test can be used) for the duration of the COVID-19 declaration under Section 564(b)(1) of the Act, 21 U.S.C. section 360bbb-3(b)(1), unless the authorization is terminated or revoked.  Performed at [facility], [address] [place], [place] [address]   MRSA PCR Screening     Status: Abnormal   Collection Time: 05/09/21 12:07 PM   Specimen: Nasopharyngeal  Result Value Ref Range Status   MRSA by PCR POSITIVE (A) NEGATIVE Final    Comment:        The GeneXpert MRSA Assay (FDA approved for NASAL specimens only), is one component of a comprehensive MRSA colonization surveillance program. It is not intended to diagnose MRSA infection nor to guide or monitor treatment for MRSA infections. RESULT CALLED TO, READ BACK BY AND VERIFIED  WITH: A.RAMIREZ,RN AT 1442 ON 05/09/21 BY GM Performed at [facility], [address] [place], [place] [address]   Aerobic Culture w Gram Stain (superficial specimen)     Status: None   Collection Time: 05/11/21  5:00 PM   Specimen: Wound  Result Value Ref Range Status   Specimen Description  Final    WOUND Performed at [facility], [address], [place], [place] [address]    Special Requests   Final    BACK Performed at [facility], [address] [place], [place] [address]    Gram Stain   Final    MODERATE WBC PRESENT, PREDOMINANTLY PMN RARE GRAM POSITIVE COCCI IN CLUSTERS Performed at [facility], [address], [place], [place] [address]    Culture FEW METHICILLIN RESISTANT STAPHYLOCOCCUS AUREUS  Final   Report Status 05/14/2021 FINAL  Final   Organism ID, Bacteria METHICILLIN RESISTANT STAPHYLOCOCCUS AUREUS  Final      Susceptibility   Methicillin resistant staphylococcus aureus - MIC*    CIPROFLOXACIN >=8 RESISTANT Resistant     ERYTHROMYCIN >=8 RESISTANT Resistant     GENTAMICIN <=0.5 SENSITIVE Sensitive     OXACILLIN >=4 RESISTANT Resistant     TETRACYCLINE <=1 SENSITIVE Sensitive     VANCOMYCIN 1 SENSITIVE Sensitive     TRIMETH/SULFA 80 RESISTANT Resistant     CLINDAMYCIN <=0.25 SENSITIVE Sensitive     RIFAMPIN <=0.5 SENSITIVE Sensitive     Inducible Clindamycin NEGATIVE Sensitive     * FEW METHICILLIN RESISTANT STAPHYLOCOCCUS AUREUS  Aerobic/Anaerobic Culture w Gram Stain (surgical/deep wound)     Status: None (Preliminary result)   Collection Time: 05/13/21  2:07 PM   Specimen: Wound; Abscess  Result Value Ref Range Status   Specimen Description   Final    WOUND Performed at [facility], [address], [place], [place] [address]    Special Requests   Final    BACK ABSCESS Performed at [facility], [address] [place], [place] [address]    Gram Stain   Final    RARE WBC PRESENT,BOTH PMN  AND MONONUCLEAR RARE GRAM POSITIVE COCCI IN CLUSTERS    Culture   Final    FEW STAPHYLOCOCCUS AUREUS SUSCEPTIBILITIES TO FOLLOW Performed at [facility], [address] [place], [place] [address]    Report Status PENDING  Incomplete         Radiology Studies: No results found.      Scheduled Meds: . atorvastatin  80 mg Oral Daily  . doxycycline  100 mg Oral Q12H  . enoxaparin (LOVENOX) injection  0.5 mg/kg Subcutaneous Q24H  . gabapentin  300 mg Oral TID  . insulin aspart  0-20 Units Subcutaneous TID WC  . insulin aspart  0-5 Units Subcutaneous QHS  . insulin aspart  6 Units Subcutaneous TID WC  . insulin detemir  30 Units Subcutaneous BID  . ketorolac  30 mg Intravenous Q6H  . loratadine  10 mg Oral Daily  . magnesium oxide  200 mg Oral BID  . nicotine  21 mg Transdermal Daily  . senna  1 tablet Oral BID  . vitamin B-12  100 mcg Oral Daily   Continuous Infusions: . sodium chloride 75 mL/hr at 05/15/21 1058     LOS: 6 days    Time spent: 35 minutes    Kahlan Engebretson A Blaine Hari, MD [facility]   If 7PM-7AM, please contact night-coverage [email or web address]  05/15/2021, 1:50 PM

## 2021-05-15 NOTE — TOC Progression Note (Signed)
Transition of Care [facility]) - Progression Note    Patient Details  Name: Ashley Snow MRN: [id number] Date of Birth: 08-02-1971  Transition [facility]) CM/SW Contact  Ashley Royalty Lutricia Feil, RN Phone [phone] 05/15/2021, 4:23 PM  Clinical Narrative:   Requested referral for [facility] for wound care with two drains to her back area. Verified with pt the home address via Epic(boading house). Pt states she can get someone down the street at the previous shelter where she resided to assist with some of her dressing changes to help with [facility] if available. RN also educated pt on utilizing the [facility] if [facility] is unable to accommodate as many days as recommended with her assistance. Pt also states she has utilized [employer] for transportation to the [employer] in the past.   RN has attempted [employer] by calling several [facility]Health agencies as followed: [facility] ([place])-unable to accommodate pt's care at this time (NO) [facility] ([place])-NO [facility] ([place])-LVM [facility])-LVM  TOC will continue to be available to assistance accordingly.   Expected Discharge Plan: Home/Self Care Barriers to Discharge: Continued Medical Work up  Expected Discharge Plan and Services Expected Discharge Plan: Home/Self Care   Discharge Planning Services: CM Consult,Medication Assistance,Indigent Health Clinic   Living arrangements for the past 2 months: Boarding House                 DME Arranged: N/A DME Agency: NA       HH Arranged: NA           Social Determinants of Health (SDOH) Interventions    Readmission Risk Interventions No flowsheet data found.

## 2021-05-16 DIAGNOSIS — L02212 Cutaneous abscess of back [any part, except buttock]: Secondary | ICD-10-CM

## 2021-05-16 LAB — GLUCOSE, CAPILLARY
Glucose-Capillary: 242 mg/dL — ABNORMAL HIGH (ref 70–99)
Glucose-Capillary: 318 mg/dL — ABNORMAL HIGH (ref 70–99)
Glucose-Capillary: 188 mg/dL — ABNORMAL HIGH (ref 70–99)
Glucose-Capillary: 384 mg/dL — ABNORMAL HIGH (ref 70–99)

## 2021-05-16 LAB — MAGNESIUM: Magnesium: 1.6 mg/dL — ABNORMAL LOW (ref 1.7–2.4)

## 2021-05-16 MED ORDER — FLUCONAZOLE 50 MG PO TABS
150.0000 mg | ORAL_TABLET | Freq: Once | ORAL | Status: AC
  Administered 2021-05-16: 17:00:00 150 mg via ORAL
  Filled 2021-05-16: qty 1

## 2021-05-16 MED ORDER — MAGNESIUM SULFATE 2 GM/50ML IV SOLN
2.0000 g | Freq: Once | INTRAVENOUS | Status: AC
  Administered 2021-05-16: 12:00:00 2 g via INTRAVENOUS
  Filled 2021-05-16: qty 50

## 2021-05-16 MED ORDER — INSULIN DETEMIR 100 UNIT/ML ~~LOC~~ SOLN
35.0000 [IU] | Freq: Two times a day (BID) | SUBCUTANEOUS | Status: DC
  Administered 2021-05-16 – 2021-05-17 (×3): 35 [IU] via SUBCUTANEOUS
  Filled 2021-05-16 (×5): qty 0.35

## 2021-05-16 MED ORDER — TRAMADOL HCL 50 MG PO TABS
50.0000 mg | ORAL_TABLET | Freq: Four times a day (QID) | ORAL | Status: DC | PRN
  Administered 2021-05-16 – 2021-05-17 (×2): 50 mg via ORAL
  Filled 2021-05-16 (×2): qty 1

## 2021-05-16 NOTE — Evaluation (Signed)
Physical Therapy Evaluation Patient Details Name: Ashley Snow MRN: [id number] DOB: 1971-05-09 Today's Date: 05/16/2021   History of Present Illness  Ashley Snow is a 49yoF who comes to [facility] on 05/09[facility]21 c pain and swelling of the upper back associates with fever. PMH: IDDM, HTN, nicotine dependence. Pt current resides in a boarding house, reports issues with bed bugs there since moving in. Pt admitted with cellulitis s/p bug bite. Pt later tested (+) for RMSF.  Clinical Impression  Pt admitted with above diagnosis. Pt currently without functional limitations or deficits. Patient is at baseline, all education completed, and time is given to address all questions/concerns. No additional skilled PT services needed at this time, PT signing off. PT recommends daily ambulation ad lib or with nursing staff as needed to prevent deconditioning.      Follow Up Recommendations No PT follow up    Equipment Recommendations  None recommended by PT    Recommendations for Other Services       Precautions / Restrictions Precautions Precautions: None Restrictions Weight Bearing Restrictions: No      Mobility  Bed Mobility Overal bed mobility: Independent                  Transfers Overall transfer level: Independent                  Ambulation/Gait Ambulation/Gait assistance: Independent Gait Distance (Feet): 270 Feet Assistive device: IV Pole   Gait velocity: 1.52m/s      Stairs            Wheelchair Mobility    Modified Rankin (Stroke Patients Only)       Balance Overall balance assessment: Independent                                           Pertinent Vitals/Pain Pain Assessment: Faces Faces Pain Scale: Hurts little more Pain Location: back wound    Home Living Family/patient expects to be discharged to:: Shelter/Homeless (boarding house or shelter) Living Arrangements: Non-relatives/Friends;Group Home     Home Access:  Stairs to enter   Secretary/administrator of Steps: 3 Home Layout: One level Home Equipment: None      Prior Function Level of Independence: Independent         Comments: independent baseline, still works full time, no falls hisotyre, no DEM use with mobility.     Hand Dominance        Extremity/Trunk Assessment   Upper Extremity Assessment Upper Extremity Assessment: Overall WFL for tasks assessed    Lower Extremity Assessment Lower Extremity Assessment: Overall WFL for tasks assessed    Cervical / Trunk Assessment Cervical / Trunk Assessment: Normal  Communication   Communication: No difficulties  Cognition Arousal/Alertness: Awake/alert Behavior During Therapy: WFL for tasks assessed/performed Overall Cognitive Status: Within Functional Limits for tasks assessed                                        General Comments      Exercises     Assessment/Plan    PT Assessment Patent does not need any further PT services  PT Problem List         PT Treatment Interventions      PT Goals (Current goals can be found in the  Care Plan section)  Acute Rehab PT Goals PT Goal Formulation: All assessment and education complete, DC therapy    Frequency     Barriers to discharge Decreased caregiver support      Co-evaluation               AM-PAC PT "6 Clicks" Mobility  Outcome Measure Help needed turning from your back to your side while in a flat bed without using bedrails?: None Help needed moving from lying on your back to sitting on the side of a flat bed without using bedrails?: None Help needed moving to and from a bed to a chair (including a wheelchair)?: None Help needed standing up from a chair using your arms (e.g., wheelchair or bedside chair)?: None Help needed to walk in hospital room?: None Help needed climbing 3-5 steps with a railing? : None 6 Click Score: 24    End of Session   Activity Tolerance: Patient tolerated  treatment well;No increased pain Patient left: in bed   PT Visit Diagnosis: Other symptoms and signs involving the nervous system (F02.774)    Time: 1287-8676 PT Time Calculation (min) (ACUTE ONLY): 21 min   Charges:   PT Evaluation $PT Eval Low Complexity: 1 Low         8:24 AM, 05/16/21 Rosamaria Lints, PT, DPT Physical Therapist - [facility]  [phone] (ASCOM)    Ashley Snow C 05/16/2021, 8:23 AM

## 2021-05-16 NOTE — Progress Notes (Signed)
PROGRESS NOTE    Ashley KaufmannMelissa Snow  ZOX:[id number] DOB: [birth year] DOA: 05/09/2021 PCP: Patient, No Pcp Per (Inactive)   Brief Narrative: 49 past medical history significant for insulin-dependent diabetes, obesity, hypertension, nicotine dependence who resides in a boardinghouse.  She presents to the ER for evaluation of pain, redness and swelling in her left upper back.  She has been having issues with bedbugs.  4 days prior to admission she noticed a bump in her left upper back that was pruritic.  She report fevers and chills, temperature 101    Assessment & Plan:   Principal Problem:   Cellulitis Active Problems:   Hypertension   Diabetes mellitus with hyperglycemia (HCC)   Nicotine dependence   Abscess of back  1-Cellulitis/Abscess  of upper back, present on admission, sepsis present on admission -Patient presented with fever 101, tachycardia, tachypnea and leukocytosis -Was initially treated with Ancef.  Currently on vancomycin -She is  on doxycycline to cover for tickborne diseases. -ID consulted. Had pus drain  From abscess, culture growing Staph Aureus sensitive to Doxy  -resume tramadol for Pain PRN.  -Burgdorfi test was discontinue ,  RMSF positive and Ehrlichia negative. On Doxy  -General Sx consulted.underwent I and D> 5/20. -Discussed with ID plan to treat with Doxy for at least 7 days for RMSF. 10 days antibiotics post Sx.  [facility] Patient has two penrose drains in place. Needs to follow up with Sx 5/27 for possible removal of drains.  [facility]   2-Diabetes with hyperglycemia: She will need assistance with medication Increase Levemir to 35 units , increase meal coverage to 6 units.   3-Peripheral neuropathy: Continue with gabapentin  4-Current smoker: Continue with nicotine patch  5-Obesity: Need lifestyle modification.  6-Hyperlipidemia:  continue with statins Hypomagnesemia; Replaced.   Anemia; monitor. Iron deficiency. She will need iron when infection is controlled. Started B 12 supplement.  Mild hyponatremia; NSL    Estimated body mass index is 33.3 kg/m as calculated from the following:   Height as of this encounter: 5\' 8"  (1.727 m).   Weight as of this encounter: 99.3 kg.   DVT prophylaxis: Lovenox Code Status: Full code Family Communication: Care discussed with patient.  Disposition Plan:  Status is: Inpatient  Remains inpatient appropriate because:IV treatments appropriate due to intensity of illness or inability to take PO   Dispo: The patient is from: Home              Anticipated d/c is to: Home              Patient currently is not medically stable to d/c.   Difficult to place patient No        Consultants:   ID  Procedures:   None  Antimicrobials:  Vancomycin   Subjective: She is eating more in the hospital. Back pain is better controlled with tramadol.   Objective: Vitals:   05/15/21 2155 05/16/21 0604 05/16/21 0811 05/16/21 1128  BP: (!) 158/90 138/83 (!) 154/96 (!) 161/100  Pulse:  84 88 91  Resp:  16 16 17   Temp:  98.5 F (36.9 C) 98.4 F (36.9 C) 98.5 F (36.9 C)  TempSrc:  Oral  Oral  SpO2:  96% 98% 98%  Weight:      Height:       No intake or output data in the 24 hours ending 05/16/21 1301 Filed Weights   05/09/21 0730  Weight:  99.3 kg    Examination:  General exam: NAD Respiratory system: CTA Cardiovascular system: S 1, S 2 RRR Gastrointestinal system: BS present, soft, nt Central nervous system: Non focal.  Extremities: No edema Skin: dressing in place back    Data Reviewed: I have personally reviewed following labs and imaging studies  CBC: Recent Labs  Lab 05/11/21 0439 05/12/21 0400 05/13/21 0356 05/14/21 0632 05/15/21 0506  WBC 11.1* 9.5 7.2 5.8 6.5  HGB 10.6* 10.9* 9.8* 9.0* 9.3*  HCT 32.3* 33.5* 30.4* 27.7* 29.0*  MCV 79.6* 80.5 80.6 80.8 80.1  PLT 189 248 268 251 307   Basic  Metabolic Panel: Recent Labs  Lab 05/11/21 0439 05/12/21 0400 05/13/21 0356 05/14/21 0632 05/15/21 0506 05/16/21 0847  NA 133* 133* 137 137 137  --   K 3.9 4.2 4.1 4.1 3.9  --   CL 100 99 101 105 105  --   CO2 26 27 27 25 26   --   GLUCOSE 306* 307* 379* 361* 311*  --   BUN 12 13 19 15 17   --   CREATININE 0.68 0.63 0.68 0.73 0.61  --   CALCIUM 8.7* 9.3 8.8* 8.9 8.9  --   MG 1.7 1.6* 1.7 1.7 1.6* 1.6*   GFR: Estimated Creatinine Clearance: 102.6 mL/min (by C-G formula based on SCr of 0.61 mg/dL). Liver Function Tests: No results for input(s): AST, ALT, ALKPHOS, BILITOT, PROT, ALBUMIN in the last 168 hours. No results for input(s): LIPASE, AMYLASE in the last 168 hours. No results for input(s): AMMONIA in the last 168 hours. Coagulation Profile: No results for input(s): INR, PROTIME in the last 168 hours. Cardiac Enzymes: No results for input(s): CKTOTAL, CKMB, CKMBINDEX, TROPONINI in the last 168 hours. BNP (last 3 results) No results for input(s): PROBNP in the last 8760 hours. HbA1C: No results for input(s): HGBA1C in the last 72 hours. CBG: Recent Labs  Lab 05/15/21 1136 05/15/21 1605 05/15/21 2111 05/16/21 0808 05/16/21 1124  GLUCAP 229* 221* 314* 242* 318*   Lipid Profile: No results for input(s): CHOL, HDL, LDLCALC, TRIG, CHOLHDL, LDLDIRECT in the last 72 hours. Thyroid Function Tests: No results for input(s): TSH, T4TOTAL, FREET4, T3FREE, THYROIDAB in the last 72 hours. Anemia Panel: No results for input(s): VITAMINB12, FOLATE, FERRITIN, TIBC, IRON, RETICCTPCT in the last 72 hours. Sepsis Labs: No results for input(s): PROCALCITON, LATICACIDVEN in the last 168 hours.  Recent Results (from the past 240 hour(s))  Blood culture (routine single)     Status: None   Collection Time: 05/09/21  7:34 AM   Specimen: BLOOD RIGHT HAND  Result Value Ref Range Status   Specimen Description BLOOD RIGHT HAND  Final   Special Requests   Final    BOTTLES DRAWN AEROBIC  AND ANAEROBIC Blood Culture results may not be optimal due to an inadequate volume of blood received in culture bottles   Culture   Final    NO GROWTH 5 DAYS Performed at [facility], [address] [place], [address] [place]    Report Status 05/14/2021 FINAL  Final  Resp Panel by RT-PCR (Flu A&B, Covid) Nasopharyngeal Swab     Status: None   Collection Time: 05/09/21  7:58 AM   Specimen: Nasopharyngeal Swab; Nasopharyngeal(NP) swabs in vial transport medium  Result Value Ref Range Status   SARS Coronavirus 2 by RT PCR NEGATIVE NEGATIVE Final    Comment: (NOTE) SARS-CoV-2 target nucleic acids are NOT DETECTED.  The SARS-CoV-2 RNA is generally detectable in  upper respiratory specimens during the acute phase of infection. The lowest concentration of SARS-CoV-2 viral copies this assay can detect is 138 copies/mL. A negative result does not preclude SARS-Cov-2 infection and should not be used as the sole basis for treatment or other patient management decisions. A negative result may occur with  improper specimen collection/handling, submission of specimen other than nasopharyngeal swab, presence of viral mutation(s) within the areas targeted by this assay, and inadequate number of viral copies(<138 copies/mL). A negative result must be combined with clinical observations, patient history, and epidemiological information. The expected result is Negative.  Fact Sheet for Patients:  BloggerCourse.com  Fact Sheet for Healthcare Providers:  SeriousBroker.it  This test is no t yet approved or cleared by the [place] FDA and  has been authorized for detection and/or diagnosis of SARS-CoV-2 by FDA under an Emergency Use Authorization (EUA). This EUA will remain  in effect (meaning this test can be used) for the duration of the COVID-19 declaration under Section 564(b)(1) of the Act, 21 U.S.C.section 360bbb-3(b)(1), unless  the authorization is terminated  or revoked sooner.       Influenza A by PCR NEGATIVE NEGATIVE Final   Influenza B by PCR NEGATIVE NEGATIVE Final    Comment: (NOTE) The Xpert Xpress SARS-CoV-2/FLU/RSV plus assay is intended as an aid in the diagnosis of influenza from Nasopharyngeal swab specimens and should not be used as a sole basis for treatment. Nasal washings and aspirates are unacceptable for Xpert Xpress SARS-CoV-2/FLU/RSV testing.  Fact Sheet for Patients: BloggerCourse.com  Fact Sheet for Healthcare Providers: SeriousBroker.it  This test is not yet approved or cleared by the [place] FDA and has been authorized for detection and/or diagnosis of SARS-CoV-2 by FDA under an Emergency Use Authorization (EUA). This EUA will remain in effect (meaning this test can be used) for the duration of the COVID-19 declaration under Section 564(b)(1) of the Act, 21 U.S.C. section 360bbb-3(b)(1), unless the authorization is terminated or revoked.  Performed at [facility], [address] [place], [place] [address]   MRSA PCR Screening     Status: Abnormal   Collection Time: 05/09/21 12:07 PM   Specimen: Nasopharyngeal  Result Value Ref Range Status   MRSA by PCR POSITIVE (A) NEGATIVE Final    Comment:        The GeneXpert MRSA Assay (FDA approved for NASAL specimens only), is one component of a comprehensive MRSA colonization surveillance program. It is not intended to diagnose MRSA infection nor to guide or monitor treatment for MRSA infections. RESULT CALLED TO, READ BACK BY AND VERIFIED WITH: A.[email or web address],RN AT 1442 ON 05/09/21 BY GM Performed at [facility], [address] [place], [place] [address]   Aerobic Culture w Gram Stain (superficial specimen)     Status: None   Collection Time: 05/11/21  5:00 PM   Specimen: Wound  Result Value Ref Range Status   Specimen Description   Final     WOUND Performed at [facility], [address] [place], [place] [address]    Special Requests   Final    BACK Performed at [facility], [address] [place], [place] [address]    Gram Stain   Final    MODERATE WBC PRESENT, PREDOMINANTLY PMN RARE GRAM POSITIVE COCCI IN CLUSTERS Performed at [facility], [address] [place], [place] [address]    Culture FEW METHICILLIN RESISTANT STAPHYLOCOCCUS AUREUS  Final   Report Status 05/14/2021 FINAL  Final  Organism ID, Bacteria METHICILLIN RESISTANT STAPHYLOCOCCUS AUREUS  Final      Susceptibility   Methicillin resistant staphylococcus aureus - MIC*    CIPROFLOXACIN >=8 RESISTANT Resistant     ERYTHROMYCIN >=8 RESISTANT Resistant     GENTAMICIN <=0.5 SENSITIVE Sensitive     OXACILLIN >=4 RESISTANT Resistant     TETRACYCLINE <=1 SENSITIVE Sensitive     VANCOMYCIN 1 SENSITIVE Sensitive     TRIMETH/SULFA 80 RESISTANT Resistant     CLINDAMYCIN <=0.25 SENSITIVE Sensitive     RIFAMPIN <=0.5 SENSITIVE Sensitive     Inducible Clindamycin NEGATIVE Sensitive     * FEW METHICILLIN RESISTANT STAPHYLOCOCCUS AUREUS  Aerobic/Anaerobic Culture w Gram Stain (surgical/deep wound)     Status: None (Preliminary result)   Collection Time: 05/13/21  2:07 PM   Specimen: Wound; Abscess  Result Value Ref Range Status   Specimen Description   Final    WOUND Performed at [facility], [address], [place], [place] [address]    Special Requests   Final    BACK ABSCESS Performed at [facility], [address] [place], [place] [address]    Gram Stain   Final    RARE WBC PRESENT,BOTH PMN AND MONONUCLEAR RARE GRAM POSITIVE COCCI IN CLUSTERS Performed at [facility], [address] [place], [place] [address]    Culture FEW METHICILLIN RESISTANT STAPHYLOCOCCUS AUREUS  Final   Report Status PENDING  Incomplete   Organism ID, Bacteria METHICILLIN RESISTANT STAPHYLOCOCCUS AUREUS  Final       Susceptibility   Methicillin resistant staphylococcus aureus - MIC*    CIPROFLOXACIN >=8 RESISTANT Resistant     ERYTHROMYCIN >=8 RESISTANT Resistant     GENTAMICIN <=0.5 SENSITIVE Sensitive     OXACILLIN >=4 RESISTANT Resistant     TETRACYCLINE <=1 SENSITIVE Sensitive     VANCOMYCIN 1 SENSITIVE Sensitive     TRIMETH/SULFA 80 RESISTANT Resistant     CLINDAMYCIN <=0.25 SENSITIVE Sensitive     RIFAMPIN <=0.5 SENSITIVE Sensitive     Inducible Clindamycin NEGATIVE Sensitive     * FEW METHICILLIN RESISTANT STAPHYLOCOCCUS AUREUS         Radiology Studies: No results found.      Scheduled Meds: . atorvastatin  80 mg Oral Daily  . doxycycline  100 mg Oral Q12H  . enoxaparin (LOVENOX) injection  0.5 mg/kg Subcutaneous Q24H  . gabapentin  300 mg Oral TID  . insulin aspart  0-20 Units Subcutaneous TID WC  . insulin aspart  0-5 Units Subcutaneous QHS  . insulin aspart  6 Units Subcutaneous TID WC  . insulin detemir  35 Units Subcutaneous BID  . loratadine  10 mg Oral Daily  . magnesium oxide  200 mg Oral BID  . nicotine  21 mg Transdermal Daily  . senna  1 tablet Oral BID  . vitamin B-12  100 mcg Oral Daily   Continuous Infusions:    LOS: 7 days    Time spent: 35 minutes    Jakaree Pickard A Khambrel Amsden, MD [facility]   If 7PM-7AM, please contact night-coverage [email or web address]  05/16/2021, 1:01 PM

## 2021-05-16 NOTE — Progress Notes (Signed)
OT Cancellation Note  Patient Details Name: Ashley Snow MRN: [id number] DOB: [birth year]   Cancelled Treatment:    Reason Eval/Treat Not Completed: OT screened, no needs identified, will sign off. Order received, chart reviewed. Pt back to baseline functional independence. No skilled OT needs identified. Will sign off. Please re-consult if additional needs arise.   Matthew Folks, OTR/L [facility] [phone]

## 2021-05-17 ENCOUNTER — Other Ambulatory Visit: Payer: Self-pay

## 2021-05-17 LAB — BASIC METABOLIC PANEL
Anion gap: 9 (ref 5–15)
BUN: 15 mg/dL (ref 6–20)
CO2: 27 mmol/L (ref 22–32)
Calcium: 8.9 mg/dL (ref 8.9–10.3)
Chloride: 101 mmol/L (ref 98–111)
Creatinine, Ser: 0.54 mg/dL (ref 0.44–1.00)
GFR, Estimated: >60 mL/min (ref >60)
Glucose, Bld: 238 mg/dL — ABNORMAL HIGH (ref 70–99)
Potassium: 4.6 mmol/L (ref 3.5–5.1)
Sodium: 137 mmol/L (ref 135–145)

## 2021-05-17 LAB — MAGNESIUM: Magnesium: 1.8 mg/dL (ref 1.7–2.4)

## 2021-05-17 LAB — GLUCOSE, CAPILLARY
Glucose-Capillary: 206 mg/dL — ABNORMAL HIGH (ref 70–99)
Glucose-Capillary: 279 mg/dL — ABNORMAL HIGH (ref 70–99)

## 2021-05-17 MED ORDER — LISINOPRIL 5 MG PO TABS
5.0000 mg | ORAL_TABLET | Freq: Every day | ORAL | Status: DC
  Administered 2021-05-17: 5 mg via ORAL
  Filled 2021-05-17 (×2): qty 1

## 2021-05-17 MED ORDER — LISINOPRIL 5 MG PO TABS
5.0000 mg | ORAL_TABLET | Freq: Every day | ORAL | 1 refills | Status: AC
  Filled 2021-05-17: qty 30, 30d supply, fill #0

## 2021-05-17 MED ORDER — TRAMADOL HCL 50 MG PO TABS: 50.0000 mg | ORAL_TABLET | Freq: Four times a day (QID) | ORAL | 0 refills | Status: AC | PRN

## 2021-05-17 NOTE — Discharge Summary (Addendum)
Physician Discharge Summary  Ashley Snow YPP:[id number] DOB: 08-11-1971 DOA: 05/09/2021  PCP: Patient, No Pcp Per (Inactive)  Admit date: 05/09/2021 Discharge date: 05/17/2021  Admitted From: Home Disposition:  Home   Recommendations for Outpatient Follow-up:  1. Follow up with PCP in 1-2 weeks 2. Please obtain BMP/CBC in one week 3. Needs B-met to follow renal function, she was started on Lisinopril.  4. Needs to follow up with surgery for wound care.  5. Needs to complete 10 days oral antibiotics from 3/19. 6. Continue to adjust insulin regimen.     Discharge Condition: Stable.  CODE STATUS: Full Code Diet recommendation: Carb Modified  Brief/Interim Summary: 49 past medical history significant for insulin-dependent diabetes, obesity, hypertension, nicotine dependence who resides in a boardinghouse.  She presents to the ER for evaluation of pain, redness and swelling in her left upper back.  She has been having issues with bedbugs.  4 days prior to admission she noticed a bump in her left upper back that was pruritic.  She report fevers and chills, temperature 101   1-Cellulitis/Abscess  of upper back, present on admission, sepsis present on admission; associated to Diabetes.  -Patient presented with fever 101, tachycardia, tachypnea and leukocytosis -Was initially treated with Ancef.  Currently on vancomycin -She is  on doxycycline to cover for tickborne diseases. -ID consulted. Had pus drain  From abscess, culture growing Staph Aureus sensitive to Doxy  -resume tramadol for Pain PRN.  -Burgdorfi test was discontinue ,  RMSF positive and Ehrlichia negative. On Doxy  -General Sx consulted.underwent I and D> 5/20. -Discussed with ID plan to treat with Doxy for at least 7 days for RMSF. 10 days antibiotics post Sx. On 5/19. -Patient has two penrose drains in place. Needs to follow up with Sx 5/27 for possible removal of drains.  -[facility].   2-Diabetes with hyperglycemia: She will need assistance with medication She recieved Levemir to 35 units, and meal coverage to 6 units.  Plan to discharge on 70/30 35 units BID>   3-Peripheral neuropathy: Continue with gabapentin  4-Current smoker: Continue with nicotine patch.  5-Obesity: Need lifestyle modification.  6-Hyperlipidemia:  continue with statins Hypomagnesemia; Replaced.  Anemia; monitor. Iron deficiency. She will need iron when infection is controlled out patient. . Started B 12 supplement.  Mild hyponatremia; NSL HTN; Started on lisinopril      Discharge Diagnoses:  Principal Problem:   Cellulitis Active Problems:   Hypertension   Diabetes mellitus with hyperglycemia ([place])   Nicotine dependence   Abscess of back    Discharge Instructions  Discharge Instructions    Diet - low sodium heart healthy   Complete by: As directed    Discharge wound care:   Complete by: As directed    Increase activity slowly   Complete by: As directed      Allergies as of 05/17/2021      Reactions   Iodine Rash      Medication List    STOP taking these medications   naproxen 500 MG tablet Commonly known as: NAPROSYN     TAKE these medications   atorvastatin 80 MG tablet Commonly known as: LIPITOR Take 1 tablet (80 mg total) by mouth daily.   BD Veo Insulin Syringe U/F 31G X 15/64" 1 ML Misc Generic drug: Insulin Syringe-Needle U-100 Use as directed   Blood Pressure Kit Use as directed in the morning and at bedtime.   cetirizine  10 MG tablet Commonly known as: ZyrTEC Allergy Take 1 tablet (10 mg total) by mouth daily.   cyanocobalamin 100 MCG tablet Take 1 tablet (100 mcg total) by mouth daily.   doxycycline 100 MG tablet Commonly known as: VIBRA-TABS Take 1 tablet (100 mg total) by mouth every 12 (twelve) hours for 9 days.   gabapentin 300 MG capsule Commonly known as: NEURONTIN Take 1 capsule (300 mg total) by mouth 3  (three) times daily. What changed:   medication strength  how much to take  when to take this   HumuLIN 70/30 (70-30) 100 UNIT/ML injection Generic drug: insulin NPH-regular Human Inject 35 Units into the skin 2 (two) times daily with a meal. Per patient she usually took 70/30 in the evenings What changed:   how much to take  when to take this   levonorgestrel 20 MCG/DAY Iud Commonly known as: MIRENA 1 each by Intrauterine route once.   lisinopril 5 MG tablet Commonly known as: ZESTRIL Take 1 tablet (5 mg total) by mouth daily. Start taking on: May 18, 2021   magnesium oxide 400 (240 Mg) MG tablet Commonly known as: MAG-OX Take 0.5 tablets (200 mg total) by mouth 2 (two) times daily.   metFORMIN 1000 MG tablet Commonly known as: GLUCOPHAGE Take 1 tablet (1,000 mg total) by mouth in the morning and at bedtime.   multivitamin with minerals tablet Take 1 tablet by mouth daily.   nicotine 21 mg/24hr patch Commonly known as: NICODERM CQ - dosed in mg/24 hours Place 1 patch (21 mg total) onto the skin daily.   polyethylene glycol powder 17 GM/SCOOP powder Commonly known as: GLYCOLAX/MIRALAX Use one scoop (17 gm) daily. Mix in 4-8 ounces of water until dissolved and drink immediately. What changed: additional instructions   traMADol 50 MG tablet Commonly known as: ULTRAM Take 1 tablet (50 mg total) by mouth every 6 (six) hours as needed for up to 5 days for severe pain or moderate pain.            Discharge Care Instructions  (From admission, onward)         Start     Ordered   05/17/21 0000  Discharge wound care:        05/17/21 1156          Follow-up Information    Piscoya, Jacqulyn Bath, MD. Schedule an appointment as soon as possible for a visit in 1 week(s).   Specialty: General Surgery Why: s/p mid back I&D, has penrose drains  5/27  Contact information: [address] [place] [address] [phone]               Allergies  Allergen Reactions  . Iodine Rash    Consultations:  Surgery   ID   Procedures/Studies: US Venous Img Lower Bilateral  Result Date: 05/09/2021 CLINICAL DATA:  Elevated D-dimer. EXAM: BILATERAL LOWER EXTREMITY VENOUS DOPPLER ULTRASOUND TECHNIQUE: Gray-scale sonography with compression, as well as color and duplex ultrasound, were performed to evaluate the deep venous system(s) from the level of the common femoral vein through the popliteal and proximal calf veins. COMPARISON:  None FINDINGS: VENOUS Normal compressibility of the common femoral, superficial femoral, and popliteal veins, as well as the visualized calf veins. Visualized portions of profunda femoral vein and great saphenous vein unremarkable. No filling defects to suggest DVT on grayscale or color Doppler imaging. Doppler waveforms show normal direction of venous flow, normal respiratory plasticity and response to augmentation. Limited views of  the contralateral common femoral vein are unremarkable. OTHER None. Limitations: none IMPRESSION: Negative. Electronically Signed   By: Nolon Nations M.D.   On: 05/09/2021 10:35   DG Chest Port 1 View  Result Date: 05/09/2021 CLINICAL DATA:  Sepsis.  Shortness of breath and nausea. EXAM: PORTABLE CHEST 1 VIEW COMPARISON:  04/24/2015 FINDINGS: The heart size and mediastinal contours are within normal limits. Both lungs are clear. The visualized skeletal structures are unremarkable. IMPRESSION: No active disease. Electronically Signed   By: Kerby Moors M.D.   On: 05/09/2021 07:54      Subjective: Back pain controlled.    Discharge Exam: Vitals:   05/17/21 0723 05/17/21 1149  BP: (!) 131/94 122/88  Pulse: 82 87  Resp: 16 20  Temp: 98.3 F (36.8 C) 98.2 F (36.8 C)  SpO2: 94% 96%     General: Pt is alert, awake, not in acute distress Cardiovascular: RRR, S1/S2 +, no rubs, no gallops Respiratory: CTA bilaterally, no wheezing, no rhonchi Abdominal: Soft,  NT, ND, bowel sounds + Extremities: no edema, no cyanosis    The results of significant diagnostics from this hospitalization (including imaging, microbiology, ancillary and laboratory) are listed below for reference.     Microbiology: Recent Results (from the past 240 hour(s))  Blood culture (routine single)     Status: None   Collection Time: 05/09/21  7:34 AM   Specimen: BLOOD RIGHT HAND  Result Value Ref Range Status   Specimen Description BLOOD RIGHT HAND  Final   Special Requests   Final    BOTTLES DRAWN AEROBIC AND ANAEROBIC Blood Culture results may not be optimal due to an inadequate volume of blood received in culture bottles   Culture   Final    NO GROWTH 5 DAYS Performed at [facility], [address] [place], [place] [address]    Report Status 05/14/2021 FINAL  Final  Resp Panel by RT-PCR (Flu A&B, Covid) Nasopharyngeal Swab     Status: None   Collection Time: 05/09/21  7:58 AM   Specimen: Nasopharyngeal Swab; Nasopharyngeal(NP) swabs in vial transport medium  Result Value Ref Range Status   SARS Coronavirus 2 by RT PCR NEGATIVE NEGATIVE Final    Comment: (NOTE) SARS-CoV-2 target nucleic acids are NOT DETECTED.  The SARS-CoV-2 RNA is generally detectable in upper respiratory specimens during the acute phase of infection. The lowest concentration of SARS-CoV-2 viral copies this assay can detect is 138 copies/mL. A negative result does not preclude SARS-Cov-2 infection and should not be used as the sole basis for treatment or other patient management decisions. A negative result may occur with  improper specimen collection/handling, submission of specimen other than nasopharyngeal swab, presence of viral mutation(s) within the areas targeted by this assay, and inadequate number of viral copies(<138 copies/mL). A negative result must be combined with clinical observations, patient history, and epidemiological information. The expected result is  Negative.  Fact Sheet for Patients:  EntrepreneurPulse.com.au  Fact Sheet for Healthcare Providers:  IncredibleEmployment.be  This test is no t yet approved or cleared by the [place] FDA and  has been authorized for detection and/or diagnosis of SARS-CoV-2 by FDA under an Emergency Use Authorization (EUA). This EUA will remain  in effect (meaning this test can be used) for the duration of the COVID-19 declaration under Section 564(b)(1) of the Act, 21 U.S.C.section 360bbb-3(b)(1), unless the authorization is terminated  or revoked sooner.       Influenza A by PCR NEGATIVE NEGATIVE Final  Influenza B by PCR NEGATIVE NEGATIVE Final    Comment: (NOTE) The Xpert Xpress SARS-CoV-2/FLU/RSV plus assay is intended as an aid in the diagnosis of influenza from Nasopharyngeal swab specimens and should not be used as a sole basis for treatment. Nasal washings and aspirates are unacceptable for Xpert Xpress SARS-CoV-2/FLU/RSV testing.  Fact Sheet for Patients: EntrepreneurPulse.com.au  Fact Sheet for Healthcare Providers: IncredibleEmployment.be  This test is not yet approved or cleared by the [place] FDA and has been authorized for detection and/or diagnosis of SARS-CoV-2 by FDA under an Emergency Use Authorization (EUA). This EUA will remain in effect (meaning this test can be used) for the duration of the COVID-19 declaration under Section 564(b)(1) of the Act, 21 U.S.C. section 360bbb-3(b)(1), unless the authorization is terminated or revoked.  Performed at [facility], [address] [place], [place] [address]   MRSA PCR Screening     Status: Abnormal   Collection Time: 05/09/21 12:07 PM   Specimen: Nasopharyngeal  Result Value Ref Range Status   MRSA by PCR POSITIVE (A) NEGATIVE Final    Comment:        The GeneXpert MRSA Assay (FDA approved for NASAL specimens only), is one  component of a comprehensive MRSA colonization surveillance program. It is not intended to diagnose MRSA infection nor to guide or monitor treatment for MRSA infections. RESULT CALLED TO, READ BACK BY AND VERIFIED WITH: A.RAMIREZ,RN AT 3716 ON 05/09/21 BY GM Performed at [facility], [address] [place], [place] [address]   Aerobic Culture w Gram Stain (superficial specimen)     Status: None   Collection Time: 05/11/21  5:00 PM   Specimen: Wound  Result Value Ref Range Status   Specimen Description   Final    WOUND Performed at [facility], [address], [place], [place] [address]    Special Requests   Final    BACK Performed at [facility], [address] [place], [place] [address]    Gram Stain   Final    MODERATE WBC PRESENT, PREDOMINANTLY PMN RARE GRAM POSITIVE COCCI IN CLUSTERS Performed at [facility], [place] [address], [place], [place] [address]    Culture FEW METHICILLIN RESISTANT STAPHYLOCOCCUS AUREUS  Final   Report Status 05/14/2021 FINAL  Final   Organism ID, Bacteria METHICILLIN RESISTANT STAPHYLOCOCCUS AUREUS  Final      Susceptibility   Methicillin resistant staphylococcus aureus - MIC*    CIPROFLOXACIN >=8 RESISTANT Resistant     ERYTHROMYCIN >=8 RESISTANT Resistant     GENTAMICIN <=0.5 SENSITIVE Sensitive     OXACILLIN >=4 RESISTANT Resistant     TETRACYCLINE <=1 SENSITIVE Sensitive     VANCOMYCIN 1 SENSITIVE Sensitive     TRIMETH/SULFA 80 RESISTANT Resistant     CLINDAMYCIN <=0.25 SENSITIVE Sensitive     RIFAMPIN <=0.5 SENSITIVE Sensitive     Inducible Clindamycin NEGATIVE Sensitive     * FEW METHICILLIN RESISTANT STAPHYLOCOCCUS AUREUS  Aerobic/Anaerobic Culture w Gram Stain (surgical/deep wound)     Status: None (Preliminary result)   Collection Time: 05/13/21  2:07 PM   Specimen: Wound; Abscess  Result Value Ref Range Status   Specimen Description   Final    WOUND Performed at [facility], [address], [place], [place] [address]    Special Requests   Final    BACK ABSCESS Performed at [facility], [address] [place], [place] [address]    Gram Stain  Final    RARE WBC PRESENT,BOTH PMN AND MONONUCLEAR RARE GRAM POSITIVE COCCI IN CLUSTERS Performed at [facility], [place] [address], [place], [place] [address]    Culture   Final    FEW METHICILLIN RESISTANT STAPHYLOCOCCUS AUREUS NO ANAEROBES ISOLATED; CULTURE IN PROGRESS FOR 5 DAYS    Report Status PENDING  Incomplete   Organism ID, Bacteria METHICILLIN RESISTANT STAPHYLOCOCCUS AUREUS  Final      Susceptibility   Methicillin resistant staphylococcus aureus - MIC*    CIPROFLOXACIN >=8 RESISTANT Resistant     ERYTHROMYCIN >=8 RESISTANT Resistant     GENTAMICIN <=0.5 SENSITIVE Sensitive     OXACILLIN >=4 RESISTANT Resistant     TETRACYCLINE <=1 SENSITIVE Sensitive     VANCOMYCIN 1 SENSITIVE Sensitive     TRIMETH/SULFA 80 RESISTANT Resistant     CLINDAMYCIN <=0.25 SENSITIVE Sensitive     RIFAMPIN <=0.5 SENSITIVE Sensitive     Inducible Clindamycin NEGATIVE Sensitive     * FEW METHICILLIN RESISTANT STAPHYLOCOCCUS AUREUS     Labs: BNP (last 3 results) No results for input(s): BNP in the last 8760 hours. Basic Metabolic Panel: Recent Labs  Lab 05/12/21 0400 05/13/21 0356 05/14/21 0632 05/15/21 0506 05/16/21 0847 05/17/21 0722  NA 133* 137 137 137  --  137  K 4.2 4.1 4.1 3.9  --  4.6  CL 99 101 105 105  --  101  CO2 27 27 25 26   --  27  GLUCOSE 307* 379* 361* 311*  --  238*  BUN 13 19 15 17   --  15  CREATININE 0.63 0.68 0.73 0.61  --  0.54  CALCIUM 9.3 8.8* 8.9 8.9  --  8.9  MG 1.6* 1.7 1.7 1.6* 1.6* 1.8   Liver Function Tests: No results for input(s): AST, ALT, ALKPHOS, BILITOT, PROT, ALBUMIN in the last 168 hours. No results for input(s): LIPASE, AMYLASE in the last 168 hours. No results for input(s): AMMONIA in the last 168 hours. CBC: Recent Labs  Lab 05/11/21 0439 05/12/21 0400  05/13/21 0356 05/14/21 0632 05/15/21 0506  WBC 11.1* 9.5 7.2 5.8 6.5  HGB 10.6* 10.9* 9.8* 9.0* 9.3*  HCT 32.3* 33.5* 30.4* 27.7* 29.0*  MCV 79.6* 80.5 80.6 80.8 80.1  PLT 189 248 268 251 307   Cardiac Enzymes: No results for input(s): CKTOTAL, CKMB, CKMBINDEX, TROPONINI in the last 168 hours. BNP: Invalid input(s): POCBNP CBG: Recent Labs  Lab 05/16/21 1124 05/16/21 1719 05/16/21 2047 05/17/21 0725 05/17/21 1151  GLUCAP 318* 188* 384* 206* 279*   D-Dimer No results for input(s): DDIMER in the last 72 hours. Hgb A1c No results for input(s): HGBA1C in the last 72 hours. Lipid Profile No results for input(s): CHOL, HDL, LDLCALC, TRIG, CHOLHDL, LDLDIRECT in the last 72 hours. Thyroid function studies No results for input(s): TSH, T4TOTAL, T3FREE, THYROIDAB in the last 72 hours.  Invalid input(s): FREET3 Anemia work up No results for input(s): VITAMINB12, FOLATE, FERRITIN, TIBC, IRON, RETICCTPCT in the last 72 hours. Urinalysis    Component Value Date/Time   COLORURINE YELLOW (A) 03/03/2016 0824   APPEARANCEUR Cloudy (A) 06/29/2016 1227   LABSPEC 1.031 (H) 03/03/2016 0824   LABSPEC 1.016 04/24/2015 2120   PHURINE 5.0 03/03/2016 0824   GLUCOSEU 3+ (A) 06/29/2016 1227   GLUCOSEU Negative 04/24/2015 2120   HGBUR 1+ (A) 03/03/2016 0824   BILIRUBINUR Negative 06/29/2016 1227   BILIRUBINUR Negative 04/24/2015 2120   KETONESUR 2+ (A) 03/03/2016 0824   PROTEINUR Trace 06/29/2016  [place] 03/03/2016 0824   NITRITE Negative 06/29/2016 1227   NITRITE NEGATIVE 03/03/2016 0824   LEUKOCYTESUR Trace (A) 06/29/2016 1227   LEUKOCYTESUR 1+ 04/24/2015 2120   Sepsis Labs Invalid input(s): PROCALCITONIN,  WBC,  LACTICIDVEN Microbiology Recent Results (from the past 240 hour(s))  Blood culture (routine single)     Status: None   Collection Time: 05/09/21  7:34 AM   Specimen: BLOOD RIGHT HAND  Result Value Ref Range Status   Specimen Description BLOOD RIGHT HAND   Final   Special Requests   Final    BOTTLES DRAWN AEROBIC AND ANAEROBIC Blood Culture results may not be optimal due to an inadequate volume of blood received in culture bottles   Culture   Final    NO GROWTH 5 DAYS Performed at [facility], [address] [place], [place] [address]    Report Status 05/14/2021 FINAL  Final  Resp Panel by RT-PCR (Flu A&B, Covid) Nasopharyngeal Swab     Status: None   Collection Time: 05/09/21  7:58 AM   Specimen: Nasopharyngeal Swab; Nasopharyngeal(NP) swabs in vial transport medium  Result Value Ref Range Status   SARS Coronavirus 2 by RT PCR NEGATIVE NEGATIVE Final    Comment: (NOTE) SARS-CoV-2 target nucleic acids are NOT DETECTED.  The SARS-CoV-2 RNA is generally detectable in upper respiratory specimens during the acute phase of infection. The lowest concentration of SARS-CoV-2 viral copies this assay can detect is 138 copies/mL. A negative result does not preclude SARS-Cov-2 infection and should not be used as the sole basis for treatment or other patient management decisions. A negative result may occur with  improper specimen collection/handling, submission of specimen other than nasopharyngeal swab, presence of viral mutation(s) within the areas targeted by this assay, and inadequate number of viral copies(<138 copies/mL). A negative result must be combined with clinical observations, patient history, and epidemiological information. The expected result is Negative.  Fact Sheet for Patients:  EntrepreneurPulse.com.au  Fact Sheet for Healthcare Providers:  IncredibleEmployment.be  This test is no t yet approved or cleared by the [place] FDA and  has been authorized for detection and/or diagnosis of SARS-CoV-2 by FDA under an Emergency Use Authorization (EUA). This EUA will remain  in effect (meaning this test can be used) for the duration of the COVID-19 declaration under Section  564(b)(1) of the Act, 21 U.S.C.section 360bbb-3(b)(1), unless the authorization is terminated  or revoked sooner.       Influenza A by PCR NEGATIVE NEGATIVE Final   Influenza B by PCR NEGATIVE NEGATIVE Final    Comment: (NOTE) The Xpert Xpress SARS-CoV-2/FLU/RSV plus assay is intended as an aid in the diagnosis of influenza from Nasopharyngeal swab specimens and should not be used as a sole basis for treatment. Nasal washings and aspirates are unacceptable for Xpert Xpress SARS-CoV-2/FLU/RSV testing.  Fact Sheet for Patients: EntrepreneurPulse.com.au  Fact Sheet for Healthcare Providers: IncredibleEmployment.be  This test is not yet approved or cleared by the [place] FDA and has been authorized for detection and/or diagnosis of SARS-CoV-2 by FDA under an Emergency Use Authorization (EUA). This EUA will remain in effect (meaning this test can be used) for the duration of the COVID-19 declaration under Section 564(b)(1) of the Act, 21 U.S.C. section 360bbb-3(b)(1), unless the authorization is terminated or revoked.  Performed at [facility], [address], [place],  [address]   MRSA PCR Screening     Status: Abnormal   Collection Time: 05/09/21 12:07  PM   Specimen: Nasopharyngeal  Result Value Ref Range Status   MRSA by PCR POSITIVE (A) NEGATIVE Final    Comment:        The GeneXpert MRSA Assay (FDA approved for NASAL specimens only), is one component of a comprehensive MRSA colonization surveillance program. It is not intended to diagnose MRSA infection nor to guide or monitor treatment for MRSA infections. RESULT CALLED TO, READ BACK BY AND VERIFIED WITH: A.RAMIREZ,RN AT 3790 ON 05/09/21 BY GM Performed at [facility] [address] [place], [place] [address]   Aerobic Culture w Gram Stain (superficial specimen)     Status: None   Collection Time: 05/11/21  5:00 PM   Specimen: Wound  Result  Value Ref Range Status   Specimen Description   Final    WOUND Performed at [facility], [address] [place], [place] [address]    Special Requests   Final    BACK Performed at [facility], [address] [place], [place] [address]    Gram Stain   Final    MODERATE WBC PRESENT, PREDOMINANTLY PMN RARE GRAM POSITIVE COCCI IN CLUSTERS Performed at [facility], [place] [address] [place], [place] [address]    Culture FEW METHICILLIN RESISTANT STAPHYLOCOCCUS AUREUS  Final   Report Status 05/14/2021 FINAL  Final   Organism ID, Bacteria METHICILLIN RESISTANT STAPHYLOCOCCUS AUREUS  Final      Susceptibility   Methicillin resistant staphylococcus aureus - MIC*    CIPROFLOXACIN >=8 RESISTANT Resistant     ERYTHROMYCIN >=8 RESISTANT Resistant     GENTAMICIN <=0.5 SENSITIVE Sensitive     OXACILLIN >=4 RESISTANT Resistant     TETRACYCLINE <=1 SENSITIVE Sensitive     VANCOMYCIN 1 SENSITIVE Sensitive     TRIMETH/SULFA 80 RESISTANT Resistant     CLINDAMYCIN <=0.25 SENSITIVE Sensitive     RIFAMPIN <=0.5 SENSITIVE Sensitive     Inducible Clindamycin NEGATIVE Sensitive     * FEW METHICILLIN RESISTANT STAPHYLOCOCCUS AUREUS  Aerobic/Anaerobic Culture w Gram Stain (surgical/deep wound)     Status: None (Preliminary result)   Collection Time: 05/13/21  2:07 PM   Specimen: Wound; Abscess  Result Value Ref Range Status   Specimen Description   Final    WOUND Performed at [facility], [address] [place], [place] [address]    Special Requests   Final    BACK ABSCESS Performed at [facility], [address] [place], [place] [address]    Gram Stain   Final    RARE WBC PRESENT,BOTH PMN AND MONONUCLEAR RARE GRAM POSITIVE COCCI IN CLUSTERS Performed at [facility], [place] [address] [place], [place] [address]    Culture   Final    FEW METHICILLIN RESISTANT STAPHYLOCOCCUS AUREUS NO ANAEROBES ISOLATED; CULTURE IN PROGRESS FOR 5 DAYS     Report Status PENDING  Incomplete   Organism ID, Bacteria METHICILLIN RESISTANT STAPHYLOCOCCUS AUREUS  Final      Susceptibility   Methicillin resistant staphylococcus aureus - MIC*    CIPROFLOXACIN >=8 RESISTANT Resistant     ERYTHROMYCIN >=8 RESISTANT Resistant     GENTAMICIN <=0.5 SENSITIVE Sensitive     OXACILLIN >=4 RESISTANT Resistant     TETRACYCLINE <=1 SENSITIVE Sensitive     VANCOMYCIN 1 SENSITIVE Sensitive     TRIMETH/SULFA 80 RESISTANT Resistant     CLINDAMYCIN <=0.25 SENSITIVE Sensitive     RIFAMPIN <=0.5 SENSITIVE Sensitive     Inducible Clindamycin NEGATIVE  Sensitive     * FEW METHICILLIN RESISTANT STAPHYLOCOCCUS AUREUS     Time coordinating discharge: 40 minutes  SIGNED:   Elmarie Shiley, MD  [facility]

## 2021-05-17 NOTE — TOC Transition Note (Signed)
Transition of Care [facility]) - CM/SW Discharge Note   Patient Details  Name: Rondell Frick MRN: [id number] Date of Birth: 09-Jun-1971  Transition of Care [facility]) CM/SW Contact:  Hetty Ely, RN Phone Number: 05/17/2021, 3:31 PM   Clinical Narrative:  Patient to be transferred home with taxi voucher. Dressing changes will be done by [facility], registration information given, however patient is open to care and will not need to register. TOC Administration to schedule appointment before discharge.     Final next level of care: Home/Self Care Barriers to Discharge: Barriers Resolved   Patient Goals and CMS Choice Patient states their goals for this hospitalization and ongoing recovery are:: To return home.      Discharge Placement                Patient to be transferred to facility by: Home via Taxi. Name of family member notified: NA    Discharge Plan and Services   Discharge Planning Services: CM Consult,Medication Assistance,Indigent Health Clinic            DME Arranged: N/A DME Agency: NA       HH Arranged: NA          Social Determinants of Health (SDOH) Interventions     Readmission Risk Interventions No flowsheet data found.

## 2021-05-17 NOTE — Progress Notes (Signed)
Inpatient Diabetes Program Recommendations  AACE/ADA: New Consensus Statement on Inpatient Glycemic Control   Target Ranges:  Prepandial:   less than 140 mg/dL      Peak postprandial:   less than 180 mg/dL (1-2 hours)      Critically ill patients:  140 - 180 mg/dL   Results for ADALYNN, CORNE (MRN [id number]) as of 05/17/2021 10:09  Ref. Range 05/16/2021 08:08 05/16/2021 11:24 05/16/2021 17:19 05/16/2021 20:47 05/17/2021 07:25  Glucose-Capillary Latest Ref Range: 70 - 99 mg/dL 703 (H) 500 (H) 938 (H) 384 (H) 206 (H)   Review of Glycemic Control  Current orders for Inpatient glycemic control:  Levemir 35 units BID, Novolog 6 units TID with meals, Novolog 0-20 units TID with meals, Novolog 0-5 units QHS  Inpatient Diabetes Program Recommendations:    Insulin: Please consider increasing Levemir to 40 units BID and meal coverage to Novolog 10 units TID with meals.  Thanks, Orlando Penner, RN, MSN, CDE Diabetes Coordinator Inpatient Diabetes Program [phone] (Team Pager from 8am to 5pm)

## 2021-05-19 LAB — AEROBIC/ANAEROBIC CULTURE W GRAM STAIN (SURGICAL/DEEP WOUND)

## 2021-05-25 ENCOUNTER — Encounter: Payer: Medicaid Other | Admitting: Physician Assistant

## 2021-05-27 ENCOUNTER — Ambulatory Visit: Payer: Medicaid Other | Admitting: Gerontology

## 2021-05-28 ENCOUNTER — Encounter: Payer: Medicaid Other | Admitting: Surgery

## 2021-06-01 ENCOUNTER — Telehealth: Payer: Self-pay | Admitting: Gerontology

## 2021-06-01 NOTE — Telephone Encounter (Signed)
-----   Message from Rolm Gala, NP sent at 05/27/2021 11:26 AM EDT ----- Ms. Murphey no showed to her appointment, pls reschedule and make a telephone note. Thank you

## 2021-06-21 ENCOUNTER — Other Ambulatory Visit: Payer: Self-pay

## 2021-08-12 LAB — AMB EXT HGBA1C: Hemoglobin A1C, External: 13.3 %

## 2021-09-03 ENCOUNTER — Inpatient Hospital Stay
Admit: 2021-09-03 | Discharge: 2021-09-03 | Disposition: A | Discharge status: Home / Self Care | Payer: PRIVATE HEALTH INSURANCE | Attending: Physician Assistant

## 2021-09-03 DIAGNOSIS — L02811 Cutaneous abscess of head [any part, except face]: Secondary | ICD-10-CM

## 2021-09-03 LAB — CBC WITH AUTO DIFFERENTIAL
Basophils %: 0 %
Basophils Absolute: 0 10*3/uL (ref 0.0–0.2)
Eosinophils %: 2 %
Eosinophils Absolute: 0.2 10*3/uL (ref 0.0–0.5)
Granulocyte Absolute Count: 0 10*3/uL (ref 0.0–0.1)
Hematocrit: 36.7 % (ref 36.0–47.0)
Hemoglobin: 11.8 g/dL — ABNORMAL LOW (ref 12.0–16.0)
Immature Granulocytes: 0 %
Lymphocytes %: 14 %
Lymphocytes Absolute: 1.6 10*3/uL (ref 1.0–4.5)
MCH: 25.4 PG — ABNORMAL LOW (ref 28.0–34.0)
MCHC: 32.2 g/dL (ref 32.0–36.0)
MCV: 78.9 FL — ABNORMAL LOW (ref 80.0–100.0)
MPV: 9.8 FL (ref 7.0–12.0)
Monocytes %: 7 %
Monocytes Absolute: 0.8 10*3/uL (ref 0.1–0.8)
NRBC Absolute: 0 10*3/uL
Neutrophils %: 77 %
Neutrophils Absolute: 9 10*3/uL — ABNORMAL HIGH (ref 1.9–7.8)
Nucleated RBCs: 0 PER 100 WBC
Platelets: 220 10*3/uL (ref 150–400)
RBC: 4.65 M/uL (ref 4.20–5.40)
RDW: 15.2 % — ABNORMAL HIGH (ref 11.5–13.5)
WBC: 11.6 10*3/uL — ABNORMAL HIGH (ref 4.8–10.8)

## 2021-09-03 LAB — LACTIC ACID, REFLEX: Lactic Acid W/ Reflex: 2.3 MMOL/L (ref 0.5–2.0)

## 2021-09-03 LAB — COMPREHENSIVE METABOLIC PANEL
ALT: 16 U/L (ref 3–35)
AST: 14 U/L — ABNORMAL LOW (ref 15–40)
Albumin/Globulin Ratio: 0.9
Albumin: 3.7 g/dL (ref 3.5–5.0)
Alkaline Phosphatase: 91 U/L (ref 35–100)
Anion Gap: 12 mmol/L
BUN: 24 MG/DL — ABNORMAL HIGH (ref 7–20)
Bun/Cre Ratio: 25 NA
CO2: 26 mmol/L (ref 20–32)
Calcium: 9.4 MG/DL (ref 8.8–10.5)
Chloride: 98 mmol/L — ABNORMAL LOW (ref 100–110)
Creatinine: 0.97 MG/DL (ref 0.40–1.20)
EGFR IF NonAfrican American: >60 mL/min/{1.73_m2} (ref >60)
GFR African American: >60 mL/min/{1.73_m2} (ref >60)
Globulin: 3.9 g/dL
Glucose: 396 mg/dL — ABNORMAL HIGH (ref 75–110)
Potassium: 4.1 mmol/L (ref 3.5–5.0)
Sodium: 132 mmol/L — ABNORMAL LOW (ref 135–145)
Total Bilirubin: 0.6 mg/dL (ref 0.10–1.20)
Total Protein: 7.6 g/dL (ref 6.2–8.0)

## 2021-09-03 LAB — METABOLIC PANEL, COMPREHENSIVE
A-G Ratio: 0.9
ALT (SGPT): 16 U/L (ref 3–35)
AST (SGOT): 14 U/L — ABNORMAL LOW (ref 15–40)
Albumin: 3.7 g/dL (ref 3.5–5.0)
Alk. phosphatase: 91 U/L (ref 35–100)
Anion gap: 12 mmol/L
BUN/Creatinine ratio: 25
BUN: 24 MG/DL — ABNORMAL HIGH (ref 7–20)
Bilirubin, total: 0.6 mg/dL (ref 0.10–1.20)
CO2: 26 mmol/L (ref 20–32)
Calcium: 9.4 MG/DL (ref 8.8–10.5)
Chloride: 98 mmol/L — ABNORMAL LOW (ref 100–110)
Creatinine: 0.97 MG/DL (ref 0.40–1.20)
GFR est AA: >60 mL/min/{1.73_m2} (ref >60)
GFR est non-AA: >60 mL/min/{1.73_m2} (ref >60)
Globulin: 3.9 g/dL
Glucose: 396 mg/dL — ABNORMAL HIGH (ref 75–110)
Potassium: 4.1 mmol/L (ref 3.5–5.0)
Protein, total: 7.6 g/dL (ref 6.2–8.0)
Sodium: 132 mmol/L — ABNORMAL LOW (ref 135–145)

## 2021-09-03 LAB — CBC WITH AUTOMATED DIFF
ABS. BASOPHILS: 0 10*3/uL (ref 0.0–0.2)
ABS. EOSINOPHILS: 0.2 10*3/uL (ref 0.0–0.5)
ABS. IMM. GRANS.: 0 10*3/uL (ref 0.0–0.1)
ABS. LYMPHOCYTES: 1.6 10*3/uL (ref 1.0–4.5)
ABS. MONOCYTES: 0.8 10*3/uL (ref 0.1–0.8)
ABS. NEUTROPHILS: 9 10*3/uL — ABNORMAL HIGH (ref 1.9–7.8)
ABSOLUTE NRBC: 0 10*3/uL
BASOPHILS: 0 %
EOSINOPHILS: 2 %
HCT: 36.7 % (ref 36.0–47.0)
HGB: 11.8 g/dL — ABNORMAL LOW (ref 12.0–16.0)
IMMATURE GRANULOCYTES: 0 %
LYMPHOCYTES: 14 %
MCH: 25.4 PG — ABNORMAL LOW (ref 28.0–34.0)
MCHC: 32.2 g/dL (ref 32.0–36.0)
MCV: 78.9 FL — ABNORMAL LOW (ref 80.0–100.0)
MONOCYTES: 7 %
MPV: 9.8 FL (ref 7.0–12.0)
NEUTROPHILS: 77 %
NRBC: 0 PER 100 WBC
PLATELET: 220 10*3/uL (ref 150–400)
RBC: 4.65 M/uL (ref 4.20–5.40)
RDW: 15.2 % — ABNORMAL HIGH (ref 11.5–13.5)
WBC: 11.6 10*3/uL — ABNORMAL HIGH (ref 4.8–10.8)

## 2021-09-03 LAB — LACTIC ACID W/REFLEX: Lactic Acid w/ Reflex: 2.3 MMOL/L — CR (ref 0.5–2.0)

## 2021-09-03 MED ORDER — TRIMETHOPRIM-SULFAMETHOXAZOLE 160 MG-800 MG TAB
160-800 mg | ORAL | Status: AC
Start: 2021-09-03 — End: 2021-09-03
  Administered 2021-09-03: 17:00:00 via ORAL

## 2021-09-03 MED ORDER — KETOROLAC TROMETHAMINE 15 MG/ML INJECTION
15 mg/mL | Freq: Once | INTRAMUSCULAR | Status: AC
Start: 2021-09-03 — End: 2021-09-03
  Administered 2021-09-03: 17:00:00 via INTRAVENOUS

## 2021-09-03 MED ORDER — TRIMETHOPRIM-SULFAMETHOXAZOLE 160 MG-800 MG TAB
160-800 mg | ORAL_TABLET | Freq: Two times a day (BID) | ORAL | 0 refills | Status: AC
Start: 2021-09-03 — End: 2021-09-13

## 2021-09-03 MED ORDER — SODIUM CHLORIDE 0.9% BOLUS IV
0.9 % | Freq: Once | INTRAVENOUS | Status: AC
Start: 2021-09-03 — End: 2021-09-03
  Administered 2021-09-03: 17:00:00 via INTRAVENOUS

## 2021-09-03 MED FILL — TRIMETHOPRIM-SULFAMETHOXAZOLE 160 MG-800 MG TAB: 160-800 mg | ORAL | Qty: 1

## 2021-09-03 MED FILL — SODIUM CHLORIDE 0.9 % IV: INTRAVENOUS | Qty: 1000

## 2021-09-03 MED FILL — KETOROLAC TROMETHAMINE 15 MG/ML INJECTION: 15 mg/mL | INTRAMUSCULAR | Qty: 1

## 2021-09-03 NOTE — ED Notes (Signed)
Skin infection left side of scalp, PT also reports left eyelid swelling and some dizziness.

## 2021-09-03 NOTE — Progress Notes (Signed)
Chart reviewed for d/c planning purposes. No needs identified at this time.     Pt is from [place], has not local PCP listed.

## 2021-09-03 NOTE — ED Provider Notes (Signed)
50 year old female presents to the emergency department for evaluation.  She says that she has traveled here from [place] to spend time with her daughter and grandchildren.  Last Friday, she noticed irritation to her left frontal scalp.  Since then the area has increased in size.  She does have a history of MRSA.  She has had no associated fevers or chills but does feel intermittently dizzy, like the room is spinning.  She is not currently dizzy.  She has also had a stye which healed on her right eye and awoke this morning with some swelling to her left eye.  She has no pain with eye movement.  No nausea or vomiting.  No neck pain or stiffness.       Past Medical History:   Diagnosis Date    Diabetes (HCC)     Hypertension        No past surgical history on file.      No family history on file.    Social History     Socioeconomic History    Marital status: Not on file     Spouse name: Not on file    Number of children: Not on file    Years of education: Not on file    Highest education level: Not on file   Occupational History    Not on file   Tobacco Use    Smoking status: Every Day     Types: Cigarettes    Smokeless tobacco: Not on file   Substance and Sexual Activity    Alcohol use: Not on file    Drug use: Not Currently     Types: Cocaine    Sexual activity: Not on file   Other Topics Concern    Not on file   Social History Narrative    Not on file     Social Determinants of Health     Financial Resource Strain: Not on file   Food Insecurity: Not on file   Transportation Needs: Not on file   Physical Activity: Not on file   Stress: Not on file   Social Connections: Not on file   Intimate Partner Violence: Not on file   Housing Stability: Not on file         ALLERGIES: Iodine    Review of Systems   All other systems reviewed and are negative.    Vitals:    09/03/21 0900   BP: 121/83   Pulse: (!) 109   Resp: 16   Temp: 98.3 ??F (36.8 ??C)   SpO2: 97%   Weight: 104.3 kg (230 lb)   Height: 5\' 8"  (1.727 m)             Physical Exam  Vitals and nursing note reviewed.   HENT:      Head: Normocephalic and atraumatic.      Right Ear: Tympanic membrane normal.      Left Ear: Tympanic membrane normal.      Nose: Nose normal.      Mouth/Throat:      Mouth: Mucous membranes are moist.      Pharynx: Oropharynx is clear.   Eyes:      Extraocular Movements: Extraocular movements intact.      Conjunctiva/sclera: Conjunctivae normal.      Pupils: Pupils are equal, round, and reactive to light.      Comments: Patient has mild edema to the left eyelid and infra orbital edema.  No surrounding erythema.  Extraocular movements  are intact.  No pain with extraocular movement.   Skin:     Comments: Folliculitis appreciated to left frontal scalp with surrounding erythema.  There is area of fluctuance to suggest early abscess.   Neurological:      Mental Status: She is alert and oriented to person, place, and time.   Psychiatric:         Mood and Affect: Mood normal.         Behavior: Behavior normal.         Thought Content: Thought content normal.         Judgment: Judgment normal.        MDM  Number of Diagnoses or Management Options  Scalp abscess  Diagnosis management comments: 50 year old female with a past medical history significant for MRSA presents to the emergency department with a left-sided scalp abscess.  Please see procedure note.  Moderate amount of pus was expressed with incision and drainage.  Patient be started on Bactrim 1 tablet by mouth b.i.d. for the next 10 days.  The periorbital edema is secondary to this infection as it is same sided.      ED Course as of 09/03/21 1312   Fri Sep 03, 2021   1228 Peripheral IV established.  Patient is hydrated with a L of normal saline IV and given 50 mg of Toradol IV for pain control.  Her scalp abscesses incised and drained.  She is started on Bactrim and given 1 tablet by mouth here in the emergency department.  She does have a leukocytosis of 11.6.  A lactic acid of 2.3.  She is visiting  from  and does not have an established primary care provider.  She is planning on staying here in [place] with her daughter and therefore she is provided with contact information to establish care with a new primary care provider.  I believe that the swelling surrounding her left eye is secondary to the scalp abscess that is now drained.  She has no evidence of preseptal cellulitis or periorbital cellulitis.  She has no evidence of entrapment.  Patient will return to the emergency department for recheck in the meantime of her symptoms persist or they worsen. [AB]      ED Course User Index  [AB] Vicenta Dunning, [place]       I&D Abcess Simple    Date/Time: 09/03/2021 11:39 AM  Performed by: Vicenta Dunning, PA  Authorized by: Vicenta Dunning, PA     Consent:     Consent obtained:  Verbal    Consent given by:  Patient    Risks, benefits, and alternatives were discussed: yes    Universal protocol:     Patient identity confirmed:  Verbally with patient  Location:     Type:  Abscess    Location:  Head    Head location:  Scalp  Pre-procedure details:     Skin preparation:  Chlorhexidine with alcohol  Sedation:     Sedation type:  None  Anesthesia:     Anesthesia method:  Local infiltration    Local anesthetic:  Lidocaine 2% WITH epi  Procedure type:     Complexity:  Simple  Procedure details:     Ultrasound guidance: no      Needle aspiration: no      Incision types:  Stab incision    Drainage:  Purulent    Drainage amount:  Moderate    Wound treatment:  Wound left open  Packing materials:  None  Post-procedure details:     Procedure completion:  Tolerated      Vital Signs for this visit:  Patient Vitals for the past 12 hrs:   Temp Pulse Resp BP SpO2   09/03/21 0900 98.3 ??F (36.8 ??C) (!) 109 16 121/83 97 %       Lab findings during this visit (only abnormal values will be noted, if no value noted then the result was normal range):  Labs Reviewed   CBC WITH AUTOMATED DIFF - Abnormal; Notable for  the following components:       Result Value    WBC 11.6 (*)     HGB 11.8 (*)     MCV 78.9 (*)     MCH 25.4 (*)     RDW 15.2 (*)     ABS. NEUTROPHILS 9.0 (*)     All other components within normal limits   METABOLIC PANEL, COMPREHENSIVE - Abnormal; Notable for the following components:    Sodium 132 (*)     Chloride 98 (*)     Glucose 396 (*)     BUN 24 (*)     AST (SGOT) 14 (*)     All other components within normal limits   LACTIC ACID W/REFLEX - Abnormal; Notable for the following components:    Lactic Acid w/ Reflex 2.3 (*)     All other components within normal limits   LACTIC ACID W/REFLEX       Radiology studies during this visit  No results found.    Medications given in the ED:  Medications   sodium chloride 0.9 % bolus infusion 1,000 mL (1,000 mL IntraVENous New Bag 09/03/21 1305)   trimethoprim-sulfamethoxazole (BACTRIM DS, SEPTRA DS) 160-800 mg per tablet 1 Tablet (1 Tablet Oral Given 09/03/21 1245)   ketorolac (TORADOL) injection 15 mg (15 mg IntraVENous Given 09/03/21 1306)       Diagnosis:    ICD-10-CM ICD-9-CM   1. Scalp abscess  L02.811 682.8       Condition at disposition:  Condition stable    Disposition:  Discharged    Discharge prescriptions and/or changes if applicable:    Current Discharge Medication List        START taking these medications    Details   trimethoprim-sulfamethoxazole (Bactrim DS) 160-800 mg per tablet Take 1 Tablet by mouth two (2) times a day for 10 days.  Qty: 20 Tablet, Refills: 0  Start date: 09/03/2021, End date: 09/13/2021             Follow-up:  No follow-up provider specified.    Please note that portions of this document were created using the M*Modal Fluency Direct dictation system.  Any inconsistencies or typographical errors may be the result of mis-transcription that persist in spite of proof-reading and should be addressed with the document creator.

## 2021-09-07 ENCOUNTER — Emergency Department
Admit: 2021-09-07 | Payer: PRIVATE HEALTH INSURANCE | Primary: Student in an Organized Health Care Education/Training Program

## 2021-09-07 ENCOUNTER — Inpatient Hospital Stay
Admit: 2021-09-07 | Discharge: 2021-09-07 | Disposition: A | Discharge status: Home / Self Care | Payer: PRIVATE HEALTH INSURANCE | Attending: Physician Assistant

## 2021-09-07 DIAGNOSIS — L02811 Cutaneous abscess of head [any part, except face]: Secondary | ICD-10-CM

## 2021-09-07 LAB — CBC WITH AUTO DIFFERENTIAL
Basophils %: 1 %
Basophils Absolute: 0.1 10*3/uL (ref 0.0–0.2)
Eosinophils %: 2 %
Eosinophils Absolute: 0.2 10*3/uL (ref 0.0–0.5)
Granulocyte Absolute Count: 0.1 10*3/uL (ref 0.0–0.1)
Hematocrit: 34.3 % — ABNORMAL LOW (ref 36.0–47.0)
Hemoglobin: 11.2 g/dL — ABNORMAL LOW (ref 12.0–16.0)
Immature Granulocytes: 1 %
Lymphocytes %: 11 %
Lymphocytes Absolute: 1.3 10*3/uL (ref 1.0–4.5)
MCH: 25.5 PG — ABNORMAL LOW (ref 28.0–34.0)
MCHC: 32.7 g/dL (ref 32.0–36.0)
MCV: 78 FL — ABNORMAL LOW (ref 80.0–100.0)
MPV: 9.4 FL (ref 7.0–12.0)
Monocytes %: 7 %
Monocytes Absolute: 0.8 10*3/uL (ref 0.1–0.8)
NRBC Absolute: 0 10*3/uL
Neutrophils %: 78 %
Neutrophils Absolute: 9.3 10*3/uL — ABNORMAL HIGH (ref 1.9–7.8)
Nucleated RBCs: 0 PER 100 WBC
Platelets: 321 10*3/uL (ref 150–400)
RBC: 4.4 M/uL (ref 4.20–5.40)
RDW: 14.8 % — ABNORMAL HIGH (ref 11.5–13.5)
WBC: 11.8 10*3/uL — ABNORMAL HIGH (ref 4.8–10.8)

## 2021-09-07 LAB — COMPREHENSIVE METABOLIC PANEL
ALT: 14 U/L (ref 3–35)
AST: 9 U/L — ABNORMAL LOW (ref 15–40)
Albumin/Globulin Ratio: 0.9
Albumin: 3.4 g/dL — ABNORMAL LOW (ref 3.5–5.0)
Alkaline Phosphatase: 82 U/L (ref 35–100)
Anion Gap: 11 mmol/L
BUN: 27 MG/DL — ABNORMAL HIGH (ref 7–20)
Bun/Cre Ratio: 23 NA
CO2: 25 mmol/L (ref 20–32)
Calcium: 9.1 MG/DL (ref 8.8–10.5)
Chloride: 102 mmol/L (ref 100–110)
Creatinine: 1.2 MG/DL (ref 0.40–1.20)
EGFR IF NonAfrican American: 50 mL/min/{1.73_m2} — ABNORMAL LOW (ref >60)
GFR African American: 57 mL/min/{1.73_m2} — ABNORMAL LOW (ref >60)
Globulin: 4 g/dL
Glucose: 297 mg/dL — ABNORMAL HIGH (ref 75–110)
Potassium: 4.6 mmol/L (ref 3.5–5.0)
Sodium: 133 mmol/L — ABNORMAL LOW (ref 135–145)
Total Bilirubin: 0.7 mg/dL (ref 0.10–1.20)
Total Protein: 7.4 g/dL (ref 6.2–8.0)

## 2021-09-07 LAB — CBC WITH AUTOMATED DIFF
ABS. BASOPHILS: 0.1 10*3/uL (ref 0.0–0.2)
ABS. EOSINOPHILS: 0.2 10*3/uL (ref 0.0–0.5)
ABS. IMM. GRANS.: 0.1 10*3/uL (ref 0.0–0.1)
ABS. LYMPHOCYTES: 1.3 10*3/uL (ref 1.0–4.5)
ABS. MONOCYTES: 0.8 10*3/uL (ref 0.1–0.8)
ABS. NEUTROPHILS: 9.3 10*3/uL — ABNORMAL HIGH (ref 1.9–7.8)
ABSOLUTE NRBC: 0 10*3/uL
BASOPHILS: 1 %
EOSINOPHILS: 2 %
HCT: 34.3 % — ABNORMAL LOW (ref 36.0–47.0)
HGB: 11.2 g/dL — ABNORMAL LOW (ref 12.0–16.0)
IMMATURE GRANULOCYTES: 1 %
LYMPHOCYTES: 11 %
MCH: 25.5 PG — ABNORMAL LOW (ref 28.0–34.0)
MCHC: 32.7 g/dL (ref 32.0–36.0)
MCV: 78 FL — ABNORMAL LOW (ref 80.0–100.0)
MONOCYTES: 7 %
MPV: 9.4 FL (ref 7.0–12.0)
NEUTROPHILS: 78 %
NRBC: 0 PER 100 WBC
PLATELET: 321 10*3/uL (ref 150–400)
RBC: 4.4 M/uL (ref 4.20–5.40)
RDW: 14.8 % — ABNORMAL HIGH (ref 11.5–13.5)
WBC: 11.8 10*3/uL — ABNORMAL HIGH (ref 4.8–10.8)

## 2021-09-07 LAB — METABOLIC PANEL, COMPREHENSIVE
A-G Ratio: 0.9
ALT (SGPT): 14 U/L (ref 3–35)
AST (SGOT): 9 U/L — ABNORMAL LOW (ref 15–40)
Albumin: 3.4 g/dL — ABNORMAL LOW (ref 3.5–5.0)
Alk. phosphatase: 82 U/L (ref 35–100)
Anion gap: 11 mmol/L
BUN/Creatinine ratio: 23
BUN: 27 MG/DL — ABNORMAL HIGH (ref 7–20)
Bilirubin, total: 0.7 mg/dL (ref 0.10–1.20)
CO2: 25 mmol/L (ref 20–32)
Calcium: 9.1 MG/DL (ref 8.8–10.5)
Chloride: 102 mmol/L (ref 100–110)
Creatinine: 1.2 MG/DL (ref 0.40–1.20)
GFR est AA: 57 mL/min/{1.73_m2} — ABNORMAL LOW (ref >60)
GFR est non-AA: 50 mL/min/{1.73_m2} — ABNORMAL LOW (ref >60)
Globulin: 4 g/dL
Glucose: 297 mg/dL — ABNORMAL HIGH (ref 75–110)
Potassium: 4.6 mmol/L (ref 3.5–5.0)
Protein, total: 7.4 g/dL (ref 6.2–8.0)
Sodium: 133 mmol/L — ABNORMAL LOW (ref 135–145)

## 2021-09-07 MED ORDER — IBUPROFEN 800 MG TAB
800 mg | ORAL_TABLET | Freq: Four times a day (QID) | ORAL | 0 refills | Status: AC | PRN
Start: 2021-09-07 — End: 2021-09-14

## 2021-09-07 MED ORDER — IOHEXOL 350 MG IODINE/ML INTRAVENOUS SOLUTION
350 mg iodine/mL | Freq: Once | INTRAVENOUS | Status: AC
Start: 2021-09-07 — End: 2021-09-07
  Administered 2021-09-07: 15:00:00 via INTRAVENOUS

## 2021-09-07 MED ORDER — FENTANYL CITRATE (PF) 50 MCG/ML IJ SOLN
50 mcg/mL | Freq: Once | INTRAMUSCULAR | Status: AC
Start: 2021-09-07 — End: 2021-09-07
  Administered 2021-09-07: 17:00:00 via INTRAVENOUS

## 2021-09-07 MED ORDER — KETOROLAC TROMETHAMINE 15 MG/ML INJECTION
15 mg/mL | Freq: Once | INTRAMUSCULAR | Status: AC
Start: 2021-09-07 — End: 2021-09-07
  Administered 2021-09-07: 15:00:00 via INTRAVENOUS

## 2021-09-07 MED ORDER — CLINDAMYCIN 150 MG CAP
150 mg | ORAL_CAPSULE | Freq: Three times a day (TID) | ORAL | 0 refills | Status: AC
Start: 2021-09-07 — End: 2021-09-14

## 2021-09-07 MED ORDER — SODIUM CHLORIDE 0.9% BOLUS IV
0.9 % | Freq: Once | INTRAVENOUS | Status: AC
Start: 2021-09-07 — End: 2021-09-07
  Administered 2021-09-07: 15:00:00 via INTRAVENOUS

## 2021-09-07 MED ORDER — CLINDAMYCIN IN D5W 900 MG/50 ML IV PIGGY BACK
900 mg/50 mL | INTRAVENOUS | Status: AC
Start: 2021-09-07 — End: 2021-09-07
  Administered 2021-09-07: 15:00:00 via INTRAVENOUS

## 2021-09-07 MED FILL — CLINDAMYCIN IN D5W 900 MG/50 ML IV PIGGY BACK: 900 mg/50 mL | INTRAVENOUS | Qty: 50

## 2021-09-07 MED FILL — FENTANYL CITRATE (PF) 50 MCG/ML IJ SOLN: 50 mcg/mL | INTRAMUSCULAR | Qty: 2

## 2021-09-07 MED FILL — OMNIPAQUE 350 MG IODINE/ML INTRAVENOUS SOLUTION: 350 mg iodine/mL | INTRAVENOUS | Qty: 64

## 2021-09-07 MED FILL — KETOROLAC TROMETHAMINE 15 MG/ML INJECTION: 15 mg/mL | INTRAMUSCULAR | Qty: 1

## 2021-09-07 MED FILL — SODIUM CHLORIDE 0.9 % IV: INTRAVENOUS | Qty: 1000

## 2021-09-07 NOTE — ED Provider Notes (Signed)
ED Provider Notes by Jetta Lout, PA at 09/07/21 1401                Author: Jetta Lout, PA  Service: --  Author Type: Physician Assistant       Filed: 09/07/21 1422  Date of Service: 09/07/21 1401  Status: Signed          Editor: Jetta Lout, PA (Physician Assistant)            Procedure Orders        1. I&D Abcess Simple [[id number]] ordered by Jetta Lout, PA                              This is a 50 year old female with complaints of bilateral scalp abscesses.  The patient has a history of MRSA.  She  has frequent skin abscesses.  Most recently she was evaluated in this emergency department on 09/03/2021.  She received an I and D of the abscess on her left scalp and was felt that the abscess on the right scalp was indurated but not fluctuant enough  to benefit from I&D.  She was placed on a course of Bactrim.  Over the course of the last several days she has had increasing pain on the right side of her head.  She has also had continued purulent drainage, which she describes as a copious amount from  the abscess on her left scalp.  She reports some nausea and subjective fevers over the last few days.   The patient just recently moved here from [place] and is waiting on insurance coverage.  She cites financial difficulties as a Lieb obstacle to her treatment.  We are going to do the best we can for her.      The history is provided by the patient.    Abscess   This is a new problem. The  current episode started more than 1 week ago. The problem  occurs constantly. The  problem has been gradually worsening. Associated symptoms include headaches. Pertinent negatives include no chest pain, no abdominal pain and no shortness  of breath. The symptoms are aggravated by exertion. The symptoms are relieved by rest. She has tried nothing for the symptoms. The treatment provided no relief.          Past Medical History:        Diagnosis  Date         ?  Diabetes (HCC)           ?  Hypertension              History reviewed. No pertinent surgical history.        History reviewed. No pertinent family history.        Social History          Socioeconomic History         ?  Marital status:  LIFE PARTNER              Spouse name:  Not on file         ?  Number of children:  Not on file     ?  Years of education:  Not on file     ?  Highest education level:  Not on file       Occupational History        ?  Not on file  Tobacco Use         ?  Smoking status:  Every Day              Types:  Cigarettes         ?  Smokeless tobacco:  Not on file       Substance and Sexual Activity         ?  Alcohol use:  Not on file     ?  Drug use:  Not Currently              Types:  Cocaine         ?  Sexual activity:  Not on file        Other Topics  Concern        ?  Not on file       Social History Narrative        ?  Not on file          Social Determinants of Health          Financial Resource Strain: Not on file     Food Insecurity: Not on file     Transportation Needs: Not on file     Physical Activity: Not on file     Stress: Not on file     Social Connections: Not on file     Intimate Partner Violence: Not on file       Housing Stability: Not on file              ALLERGIES: Iodine      Review of Systems    Constitutional:  Positive for diaphoresis and fever  (Subjective). Negative for activity change, appetite change and chills.    HENT:  Positive for ear pain. Negative for ear discharge, facial swelling, hearing loss, sore throat and trouble swallowing.     Eyes: Negative.     Respiratory: Negative.  Negative for shortness of breath.     Cardiovascular: Negative.  Negative for chest pain.    Gastrointestinal:  Positive for nausea. Negative for abdominal pain and vomiting.    Musculoskeletal: Negative.     Skin:  Positive for wound.    Allergic/Immunologic: Negative.     Neurological:  Positive for headaches.    Hematological:  Positive for adenopathy.    Psychiatric/Behavioral: Negative.           Vitals:           09/07/21 0815        BP:  134/82     Pulse:  (!) 110     Resp:  16     Temp:  99.7 ??F (37.6 ??C)     SpO2:  97%     Weight:  104.8 kg (231 lb)        Height:  5\' 8"  (1.727 m)                Physical Exam   Vitals and nursing note reviewed.    Constitutional:        General: She is not in acute distress.      Appearance: Normal appearance. She is not ill-appearing or diaphoretic.    HENT:       Head:            Comments: Abscess      Right Ear: External ear normal.       Left Ear: External ear normal.       Mouth/Throat:  Mouth: Mucous membranes are moist.    Eyes:       Pupils: Pupils are equal, round, and reactive to light.     Cardiovascular:       Rate and Rhythm: Regular rhythm. Tachycardia present.       Heart sounds: Normal heart sounds.    Pulmonary:       Breath sounds: Normal breath sounds.     Musculoskeletal:          General: Normal range of motion.       Cervical back: Normal range of motion.     Lymphadenopathy:       Cervical: Cervical adenopathy present.    Skin:      Findings: Lesion present.    Neurological:       Mental Status: She is alert and oriented to person, place, and time.           MDM   Number of Diagnoses or Management Options   History of MRSA infection   Scalp abscess   Diagnosis management comments: The abscess on the right side of the scalp is fluctuant today.  We will perform I&D.  Please see procedure note.  The patient also received nine hundred mg of clindamycin IV.  She asked for premedication prior to I&D with  improvement.     A CT is performed today to evaluate the extent of the infection.  It does look like the abscess on the right side has displaced 1 of the muscles of mastication which is undoubtedly why with jaw movement.                I&D Abcess Simple      Date/Time: 09/07/2021 2:17 PM   Performed by:  Jetta Lout, PA   Authorized by:  Jetta Lout, PA       Consent:      Consent obtained:  Verbal     Consent given by:  Patient     Risks, benefits, and  alternatives were discussed: yes       Risks discussed:  Bleeding, incomplete drainage, pain and infection     Alternatives discussed:  No treatment   Universal protocol:      Patient identity confirmed:  Verbally with patient   Location:      Type:  Abscess     Size:  1) 4cm 2) 6 cm     Location:  Head     Head location:  Scalp   Pre-procedure details:      Skin preparation:  Chloroxylenol   Sedation:      Sedation type:  None   Anesthesia:      Anesthesia method:  Local infiltration     Local anesthetic:  Lidocaine 2% WITH epi   Procedure type:      Complexity:  Simple   Procedure details:      Ultrasound guidance: no       Needle aspiration: no       Incision types:  Single straight     Incision depth:  Subcutaneous     Wound management:  Probed and deloculated and irrigated with saline     Drainage:  Purulent     Drainage amount:  Copious     Packing materials:  1/4 in gauze     Amount 1/4":  6   Post-procedure details:      Procedure completion:  Tolerated   Comments:       Two I and  D's were performed.  One on the left side and one on the of the scalp.  Both were packed with quarter-inch gauze.         Vital Signs for this visit:   Patient Vitals for the past 12 hrs:            Temp  Pulse  Resp  BP  SpO2            09/07/21 0815  99.7 ??F (37.6 ??C)  (!) 110  16  134/82  97 %           Lab findings during this visit (only abnormal values will be noted, if no value noted then the result was normal range):     Labs Reviewed       METABOLIC PANEL, COMPREHENSIVE - Abnormal; Notable for the following components:            Result  Value            Sodium  133 (*)         Glucose  297 (*)         BUN  27 (*)         GFR est AA  57 (*)         GFR est non-AA  50 (*)         AST (SGOT)  9 (*)         Albumin  3.4 (*)            All other components within normal limits       CBC WITH AUTOMATED DIFF - Abnormal; Notable for the following components:            WBC  11.8 (*)         HGB  11.2 (*)         HCT  34.3 (*)          MCV  78.0 (*)         MCH  25.5 (*)         RDW  14.8 (*)         ABS. NEUTROPHILS  9.3 (*)            All other components within normal limits           Radiology studies during this visit   CT HEAD W CONT      Result Date: 09/07/2021   1. Multifocal scalp swellings consistent with nonspecific inflammation. 2. On the right, the inflammatory process appears to involve the right temporalis muscle which is swollen as well as portions of the Galeal aponeurosis.  There is also edema extending  inferiorly in the subcutaneous fat of the right upper cheek without any definite involvement of deeper facial structures. 3. On the left, the inflammatory process in the scalp contains air bubbles likely related to ulceration of the overlying skin. 4.  No focal bony destruction to suggest osteomyelitis.        Medications given in the ED:     Medications       sodium chloride 0.9 % bolus infusion 1,000 mL (0 mL IntraVENous IV Completed 09/07/21 1210)     clindamycin (CLEOCIN) 900mg  D5W 22mL IVPB (premix) (0 mg IntraVENous IV Completed 09/07/21 1140)     ketorolac (TORADOL) injection 15 mg (15 mg IntraVENous Given 09/07/21 1107)     iohexoL (OMNIPAQUE) 350 mg iodine/mL contrast injection 64 mL (64 mL IntraVENous Given 09/07/21  1120)       fentaNYL citrate (PF) injection 25 mcg (25 mcg IntraVENous Given 09/07/21 1309)           Diagnosis:            ICD-10-CM  ICD-9-CM          1.  Scalp abscess   L02.811  682.8          2.  History of MRSA infection   Z86.14  V12.04           Condition at disposition:   Condition stable      Disposition:   DischargedHome      Discharge prescriptions and/or changes if applicable:       Current Discharge Medication List                 START taking these medications          Details        clindamycin (CLEOCIN) 150 mg capsule  Take 2 Capsules by mouth three (3) times daily for 7 days.   Qty: 42 Capsule, Refills: 0   Start date: 09/07/2021, End date: 09/14/2021               ibuprofen (MOTRIN) 800 mg  tablet  Take 1 Tablet by mouth every six (6) hours as needed for Pain for up to 7 days.   Qty: 20 Tablet, Refills: 0   Start date: 09/07/2021, End date: 09/14/2021                         Follow-up:   [facility] EMERGENCY DEPARTMENT   [address] [place] [address]   [phone]   In 2 days   For wound re-check         Please note that portions of this document were created using the M*Modal Fluency Direct dictation system.  Any inconsistencies or typographical errors may be the result of mis-transcription that persist in spite of proof-reading and should be addressed  with the document creator.

## 2021-09-07 NOTE — ED Notes (Signed)
Pt states that she has a history of skin abscesses, seen here on Friday for skin abscess on head, put on antibiotic after I&D, now has another abscess, with fever, felling awful.

## 2021-09-16 NOTE — Telephone Encounter (Signed)
Recently moved from [place]      Pt reached out to [facility] stating that she has been having issues with her diabetes and needed to be seen as soon as possible    Bridge & np't    10/3 at 9am-SDC    11/3 @ 2pm-np't

## 2021-09-27 ENCOUNTER — Inpatient Hospital Stay: Admit: 2021-09-27 | Payer: MEDICAID | Primary: Family

## 2021-09-27 ENCOUNTER — Ambulatory Visit: Admit: 2021-09-27 | Discharge: 2021-09-27 | Payer: MEDICAID | Attending: Physician Assistant | Primary: Family

## 2021-09-27 DIAGNOSIS — L02811 Cutaneous abscess of head [any part, except face]: Secondary | ICD-10-CM

## 2021-09-27 MED ORDER — FLUCONAZOLE 150 MG PO TABS
150 MG | ORAL_TABLET | ORAL | 1 refills | Status: AC
Start: 2021-09-27 — End: ?

## 2021-09-27 MED ORDER — NAPROXEN 500 MG PO TABS
500 MG | ORAL_TABLET | Freq: Two times a day (BID) | ORAL | 3 refills | Status: AC
Start: 2021-09-27 — End: 2021-11-26

## 2021-09-27 MED ORDER — INSULIN ASPART 100 UNIT/ML SC SOPN
100 UNIT/ML | Freq: Three times a day (TID) | SUBCUTANEOUS | 3 refills | Status: AC
Start: 2021-09-27 — End: 2021-10-27

## 2021-09-27 NOTE — Progress Notes (Signed)
[facility] INTERNAL MEDICINE   [address]  [place] [address]    CHIEF COMPLAINT     Chief Complaint   Patient presents with    Diabetes     Patient is here today for a new patient appointment,/bridge. She has diabetes and has questions and concerns. She does not have insulin left and needs new prescription and for naproxen for hip pain.        HPI   Gabrielle Daugherty is a 50 y.o. female seen in Same Day Care today.  Gabrielle Daugherty is here for a bridge appointment, as she will be establishing care on October 28, 2021 with Laren Everts.   One month ago she moved here from [place].    Problem #1:  Type 2 Diabetes --  Gabrielle Daugherty is a type 2 diabetic.  She is getting ready to start a part-time job at [employer].  She would appreciate a Novalox Pen rather that using Lantus because it is easier for her to use when she is at work.  She has used that in the past.    Problem #2:  Abscesses on scalp --  she has been to the emergency room twice for large abscesses, 1 on her right scalp over the right ear, and the other on the left parietal scalp.  In the emergency room they did I&D on both, and she described it is extremely painful.  There is still a little bit of fluctuance and irritation on the abscess of the left scalp, but the abscess on the right scalp continues to drain pus.  3 times a day she pushes pus from above the little opening that is always there, and a lot of yellow/green pus comes out.  This has not stopped happening, though she is slowly improving.  About 2 weeks ago she finished a prescription for Bactrim DS.  The ER gave her another prescription for clindamycin, but she has not filled yet.  She is planning on filling the prescription today or tomorrow for clindamycin.    She says that the abscesses in her too painful for her to tolerate another I&D without "being put out."   She would like conscious sedation have these abscesses fully resolved and treated.  Unfortunately, the ER did not do a wound  culture of these abscesses.  They did do a head CT that was negative for any brain abscess.    Problem #3:  Vaginal yeast infections: -- she feels certain she has a current vaginal yeast infection.  Her vagina feels more itchy than usual, and she says there is a white cottage cheese discharge.  She says this happens almost every time she takes an antibiotic, and she recently finished a course of Bactrim.  She usually treats it with 2 doses of Diflucan several days apart.    Problem #4:  Left hip bursitis --  she has had varying degrees of left hip pain for the last 3 years.  She takes naproxen 500 mg b.i.d. for the last several years.  Needs a refill on that.  She says in the prox and works well.      Active Problem List, Medications, Past Medical and Allergies reviewed and updated.    REVIEW OF SYSTEMS   See HPI    PHYSICAL EXAM     Vitals:    09/27/21 0909   BP: (!) 180/112   Pulse: 98   Temp: 98.7 ??F (37.1 ??C)   SpO2: 98%  Physical Exam  Vitals reviewed.   Constitutional:       Appearance: Normal appearance.      Comments: Body Habitus:  Protuberant abdomen, Overweight.  Thin legs.     HENT:      Head: Normocephalic and atraumatic.      Comments: Scalp:  Varying degrees of alopecia.  On the left parietal scalp, she has a 3 mm opening at the middle of an abscess, and it is very easy to squeeze out a thic, yellow purulence.    Of the right parietal scalp, she has a mostly healed old abscess, but it still has a mild amount of fluctuance to it.  The the the center of the right parietal scalp abscess has a scab on it.  This 1 has not been draining.  The left parietal abscess is draining several times a day.     Right Ear: Tympanic membrane normal.      Left Ear: Tympanic membrane normal.      Mouth/Throat:      Mouth: Mucous membranes are moist.   Eyes:      Conjunctiva/sclera: Conjunctivae normal.   Cardiovascular:      Rate and Rhythm: Normal rate and regular rhythm.      Heart sounds: Normal heart sounds.    Pulmonary:      Effort: Pulmonary effort is normal.      Breath sounds: Normal breath sounds.   Musculoskeletal:         General: Normal range of motion.      Cervical back: Neck supple.      Comments: Right hip:  Mild tenderness to palpation of the greater trochanter bursitis.  Passive external and internal rotation did not reproduce pain   Lymphadenopathy:      Cervical: No cervical adenopathy.   Skin:     General: Skin is warm and dry.   Neurological:      General: No focal deficit present.      Mental Status: She is alert and oriented to person, place, and time.   Psychiatric:         Mood and Affect: Mood normal.         Thought Content: Thought content normal.         Judgment: Judgment normal.      Comments: Pleasant, but very loquacious, and rarely took more than a few seconds break from talking.  I know she had a lot to say during this visit, felt she needed to say at all, which I can appreciate.            LABS/IMAGING     Results for orders placed or performed during the hospital encounter of 09/27/21   Culture, Anaerobic and Aerobic    Specimen: Abscess   Result Value Ref Range    Special Requests POSSIBLE MRSA     Gram stain MANY WBC'S     Gram stain NO ORGANISMS SEEN     Gram stain Smear Reviewed by Microbiology     Culture PENDING            ASSESSMENT AND PLAN   1. Abscess, scalp  -     External Referral To General Surgery  -     Culture, Anaerobic and Aerobic; Future  2. Type 2 diabetes mellitus with diabetic neuropathic arthropathy, with long-term current use of insulin (HCC)  -     insulin aspart (NOVOLOG) 100 UNIT/ML injection pen; Inject 25 Units into the skin 3 times  daily (before meals), Disp-5 Adjustable Dose Pre-filled Pen Syringe, R-3Normal  3. Bursitis of left hip, unspecified bursa  -     naproxen (NAPROSYN) 500 MG tablet; Take 1 tablet by mouth 2 times daily (with meals), Disp-60 tablet, R-3Normal  4. Vaginal yeast infection  -     fluconazole (DIFLUCAN) 150 MG tablet; Take one pill in  3 days and another pill after finishing antibiotic, Disp-2 tablet, R-1Normal    MDM:  Her greatest problem today is the abscess is on her scalp, particularly the left parietal scalp abscess the drains pus every day over the last 3 weeks, and always has a small 3 mm opening at the center.  It is good that it is actively draining, but it seems to be going on too long, and I suspect of fistula.  Will refer to surgery.    Regarding the the left hip bursitis, warned her that using naproxen 500 mg on a daily basis for several years has risk factors, particularly a GI bleed.   Gave her a refill of naproxen 500 mg, but urged her to only use it 1 week at a time, and only when her right hip feels flared up, and not to use it on a long-term chronic basis.  She feels that might actually work for her, she does not necessarily think she needs to be on it every week.    Told her, that if she needs repeated treatment for vaginal yeast, will need to get cultures to make sure she truly has vaginal yeast.    This was a 55 minute visit, with greater than 50% of visit was spent face to face counseling the patient, also which included as discussion of the rationale of the the diagnosis, risks, treatment options and plan, and coordination of care.   This was a lengthy visit, mostly because the patient wanted to talk about several medical problems, and she talked at length about each 1 of them, and she even included long discussions about her past medical history.  History taking and counseling was comprehensive and time consuming.    Total visit 55 minutes.  Face to face: 50  Record review: 5 (Reviewed with and in the presence of the patient)  Documentation: 5  Procedure: n/a      Patient Instructions   For the hip try Tumeric 400mg  or 450mg  twice a day daily.   This may allow you to get off the Naproxen for your left hip    We refilled the prescription for Naproxen -- use it twice a day for a week, then try to stay off it for a week or  two.      We refilled all your needed prescriptions    I am suspicious of a fistula tract for the left head abscess -- referral to surgery    You have a prescription for clindamycin given to you by the ER.   Use it for one week    We did a culture of the pus coming from your abscess -- it takes 2 days for the culture to come back       No follow-up provider specified.  Future Appointments   Date Time Provider Department Center   10/28/2021  2:00 PM , FNP [facility]       Garrit Marrow 13/02/2021 [place], Lacretia Nicks  09/27/2021

## 2021-09-27 NOTE — Patient Instructions (Signed)
For the hip try Tumeric 400mg  or 450mg  twice a day daily.   This may allow you to get off the Naproxen for your left hip    We refilled the prescription for Naproxen -- use it twice a day for a week, then try to stay off it for a week or two.      We refilled all your needed prescriptions    I am suspicious of a fistula tract for the left head abscess -- referral to surgery    You have a prescription for clindamycin given to you by the ER.   Use it for one week    We did a culture of the pus coming from your abscess -- it takes 2 days for the culture to come back

## 2021-09-30 LAB — CULTURE, ANAEROBIC AND AEROBIC: Gram stain: NONE SEEN

## 2021-09-30 NOTE — Telephone Encounter (Signed)
Call Gabrielle Daugherty and let her know that the scalp abscess culture did grow out MRSA.   However, the sensitivity report shows that it is not resistant to clindamycin.  Clindamycin is the prescription she started a few days ago.    When I saw Gabrielle Daugherty 3 days ago, I did mention to her that the most important part of the treatment is to continue to push the pus out of the scalp abscess, which was actually easy for her to do.  We also discussed that there may be a fistula that keeps feeding this abscess, and that the surgery team that she was referred to may have to clean all of this out.   She should take the antibiotic for 1 week, clindamycin, but that is a small part of the treatment and just a small part of this getting better.  Continuing to drain this abscess, something she can do by pushing on it, is still more important than taking the antibiotic.

## 2021-09-30 NOTE — Telephone Encounter (Signed)
Called patient and left msg on answering machine to call us back about her culture results for her scalp abscess. Rerouted to Covid and PCP to try again tomorrow. Pt could return my phone call at any point which is why I route back to us/PCP.

## 2021-10-01 NOTE — Telephone Encounter (Signed)
Pt returned call and I relayed Eric's message. Pt reports that since Monday, it has gotten much better and the Clindamycin does seem to be helping regardless of it being resistant. Pt reports that her smallest wound is almost basically healed and her second wound is getting much better. Pt reports that she is squeezing it twice a day and it has now gone from milky white to hardly any pus and mostly blood. Pt reports that there is a sac and it is now coming out of the wound. Pt reports she has been putting gauze on it and seems to be doing much better.

## 2021-10-01 NOTE — Telephone Encounter (Signed)
Left a VM for patient to return call so I can relay Eric's message.

## 2021-10-28 ENCOUNTER — Encounter

## 2021-10-28 ENCOUNTER — Ambulatory Visit: Payer: MEDICAID | Attending: Family | Primary: Family

## 2021-10-28 NOTE — Telephone Encounter (Signed)
Medications Requested:  Requested Prescriptions     Pending Prescriptions Disp Refills    naproxen (NAPROSYN) 500 MG tablet 60 tablet 3     Sig: Take 1 tablet by mouth 2 times daily (with meals)       Preferred Pharmacy:   [facility] [address] [place] - [address] - P [phone] Carmon Ginsberg [phone]  [address]  [place] [address]  Phone: [phone] Fax: [phone]      Date of Last Refill: 09/27/2021 for 60tab and 3ref      Prescription Refill Protocol reviewed: Yes        Allergy List Reviewed and Verified: Yes    Possible medication to medication interactions reviewed: Yes    Last appt @ PCP Office: 09/27/2021     Future Appointments   Date Time Provider Department Center   10/28/2021  2:00 PM Jake Samples, FNP [facility]       MOST RECENT BLOOD PRESSURES  BP Readings from Last 3 Encounters:   09/27/21 (!) 180/112         MOST RECENT LAB DATA  Lab Results   Component Value Date/Time    K 4.6 09/07/2021 08:51 AM    ALT 14 09/07/2021 08:51 AM    HGB 11.2 09/07/2021 08:51 AM    HCT 34.3 09/07/2021 08:51 AM             I will send to provider for approval of 90 day supply.

## 2021-10-28 NOTE — Telephone Encounter (Signed)
Gabrielle Daugherty  1971/11/24      Call back needed: No    Preferred call back number: Home phone  [phone] (home)    Telephone Information:   Mobile [phone]        Medications Requested:  Requested Prescriptions     Pending Prescriptions Disp Refills    naproxen (NAPROSYN) 500 MG tablet 60 tablet 3     Sig: Take 1 tablet by mouth 2 times daily (with meals)       Preferred Pharmacy:   [facility] [address], [place] - [address] - P [id number] Carmon Ginsberg [phone]  [address]  [place] [address]  Phone: [phone] Fax: [phone]    Pharmacy needs 90 day supply in order for insurance to cover it

## 2022-01-13 NOTE — Telephone Encounter (Signed)
Verne Grain E Look  [facility]  Hello,     Received fax from [facility] stating that pt cancelled their 10/28/2021 appointment due to family emergency. Their office has been unable to reach pt to reschedule. Please advise.     Thank you.

## 2022-01-13 NOTE — Telephone Encounter (Signed)
Called number listed on pt's profile, a gentleman answered. I asked if pt was available, he stated she was not and asked to take a message. I asked if he could have her call our office, Will wait for call back. If she calls back, please see if she plans to call them and reschedule. It is okay for non-clinical staff to relay my exact comments to the patient if they cannot be reached and call back for results.

## 2022-01-14 NOTE — Telephone Encounter (Signed)
Attempted to call pt, no answer and no option to leave voicemail. If pt calls back, please relay message from note below. It is okay for non-clinical staff to relay my exact comments to the patient if they cannot be reached and call back for results.

## 2022-01-17 NOTE — Telephone Encounter (Signed)
Sent pt letter asking if she still needs to be seen by [facility] surgery.

## 2022-11-21 ENCOUNTER — Other Ambulatory Visit (HOSPITAL_COMMUNITY): Payer: Self-pay

## 2023-07-19 ENCOUNTER — Telehealth: Payer: Self-pay

## 2023-07-19 NOTE — Telephone Encounter (Signed)
Pt is ineligible for clinic [place].

## 2023-08-23 ENCOUNTER — Other Ambulatory Visit: Payer: Self-pay

## 2023-09-27 ENCOUNTER — Encounter

## 2023-12-08 ENCOUNTER — Encounter

## 2024-01-09 ENCOUNTER — Encounter

## 2024-01-16 ENCOUNTER — Inpatient Hospital Stay: Payer: MEDICAID

## 2024-01-23 ENCOUNTER — Encounter

## 2024-01-24 ENCOUNTER — Ambulatory Visit: Payer: MEDICAID

## 2024-01-31 ENCOUNTER — Inpatient Hospital Stay: Admit: 2024-01-31 | Payer: MEDICAID

## 2024-01-31 DIAGNOSIS — R1310 Dysphagia, unspecified: Secondary | ICD-10-CM

## 2024-01-31 MED ORDER — IOHEXOL 350 MG/ML IV SOLN
350 | Freq: Once | INTRAVENOUS | Status: AC | PRN
Start: 2024-01-31 — End: 2024-01-31
  Administered 2024-01-31: 14:00:00 70 mL via INTRAVENOUS

## 2024-01-31 MED FILL — OMNIPAQUE 350 MG/ML IV SOLN: 350 MG/ML | INTRAVENOUS | Qty: 100

## 2024-09-09 ENCOUNTER — Encounter

## 2024-09-18 ENCOUNTER — Inpatient Hospital Stay: Admit: 2024-09-18 | Payer: MEDICAID

## 2024-09-18 MED ORDER — TECHNET TC 99M SULFUR COLLOID CO KIT
Freq: Once | Status: AC | PRN
Start: 2024-09-18 — End: 2024-09-18
  Administered 2024-09-18: 12:00:00 0.507 via ORAL
